# Patient Record
Sex: Male | Born: 1940 | Race: White | Hispanic: No | Marital: Married | Smoking: Former smoker
Health system: Southern US, Community
[De-identification: ages and names within clinical notes are randomized; demographics above are authoritative.]

## PROBLEM LIST (undated history)

## (undated) DIAGNOSIS — L508 Other urticaria: Secondary | ICD-10-CM

## (undated) DIAGNOSIS — G4733 Obstructive sleep apnea (adult) (pediatric): Secondary | ICD-10-CM

## (undated) DIAGNOSIS — K219 Gastro-esophageal reflux disease without esophagitis: Secondary | ICD-10-CM

## (undated) DIAGNOSIS — F0390 Unspecified dementia without behavioral disturbance: Secondary | ICD-10-CM

## (undated) DIAGNOSIS — E785 Hyperlipidemia, unspecified: Secondary | ICD-10-CM

## (undated) DIAGNOSIS — K635 Polyp of colon: Secondary | ICD-10-CM

## (undated) DIAGNOSIS — M419 Scoliosis, unspecified: Secondary | ICD-10-CM

## (undated) DIAGNOSIS — K5790 Diverticulosis of intestine, part unspecified, without perforation or abscess without bleeding: Secondary | ICD-10-CM

## (undated) HISTORY — PX: OTHER SURGICAL HISTORY: SHX169

## (undated) HISTORY — PX: APPENDECTOMY: SHX54

## (undated) HISTORY — DX: Scoliosis, unspecified: M41.9

## (undated) HISTORY — DX: Obstructive sleep apnea (adult) (pediatric): G47.33

## (undated) HISTORY — PX: HERNIA REPAIR: SHX51

## (undated) HISTORY — DX: Unspecified dementia, unspecified severity, without behavioral disturbance, psychotic disturbance, mood disturbance, and anxiety: F03.90

---

## 2012-01-24 ENCOUNTER — Ambulatory Visit: Payer: Self-pay | Admitting: Gastroenterology

## 2012-03-12 ENCOUNTER — Ambulatory Visit: Payer: Self-pay | Admitting: Gastroenterology

## 2014-02-05 DIAGNOSIS — N400 Enlarged prostate without lower urinary tract symptoms: Secondary | ICD-10-CM | POA: Insufficient documentation

## 2014-06-08 ENCOUNTER — Ambulatory Visit: Payer: Self-pay | Admitting: Gastroenterology

## 2014-08-05 DIAGNOSIS — M1990 Unspecified osteoarthritis, unspecified site: Secondary | ICD-10-CM | POA: Insufficient documentation

## 2014-08-05 DIAGNOSIS — E782 Mixed hyperlipidemia: Secondary | ICD-10-CM | POA: Insufficient documentation

## 2014-09-14 LAB — SURGICAL PATHOLOGY

## 2016-02-08 DIAGNOSIS — Z0000 Encounter for general adult medical examination without abnormal findings: Secondary | ICD-10-CM | POA: Diagnosis not present

## 2016-02-08 DIAGNOSIS — Z125 Encounter for screening for malignant neoplasm of prostate: Secondary | ICD-10-CM | POA: Diagnosis not present

## 2016-02-14 DIAGNOSIS — R5382 Chronic fatigue, unspecified: Secondary | ICD-10-CM | POA: Diagnosis not present

## 2016-02-14 DIAGNOSIS — Z0000 Encounter for general adult medical examination without abnormal findings: Secondary | ICD-10-CM | POA: Diagnosis not present

## 2016-02-14 DIAGNOSIS — Z23 Encounter for immunization: Secondary | ICD-10-CM | POA: Diagnosis not present

## 2016-02-14 DIAGNOSIS — G4710 Hypersomnia, unspecified: Secondary | ICD-10-CM | POA: Diagnosis not present

## 2016-03-24 DIAGNOSIS — D1801 Hemangioma of skin and subcutaneous tissue: Secondary | ICD-10-CM | POA: Diagnosis not present

## 2016-03-24 DIAGNOSIS — L821 Other seborrheic keratosis: Secondary | ICD-10-CM | POA: Diagnosis not present

## 2016-03-24 DIAGNOSIS — D485 Neoplasm of uncertain behavior of skin: Secondary | ICD-10-CM | POA: Diagnosis not present

## 2016-03-24 DIAGNOSIS — C44619 Basal cell carcinoma of skin of left upper limb, including shoulder: Secondary | ICD-10-CM | POA: Diagnosis not present

## 2016-05-05 DIAGNOSIS — C44619 Basal cell carcinoma of skin of left upper limb, including shoulder: Secondary | ICD-10-CM | POA: Diagnosis not present

## 2016-05-19 DIAGNOSIS — J40 Bronchitis, not specified as acute or chronic: Secondary | ICD-10-CM | POA: Diagnosis not present

## 2016-06-01 DIAGNOSIS — J40 Bronchitis, not specified as acute or chronic: Secondary | ICD-10-CM | POA: Diagnosis not present

## 2016-06-01 DIAGNOSIS — J4522 Mild intermittent asthma with status asthmaticus: Secondary | ICD-10-CM | POA: Diagnosis not present

## 2016-06-01 DIAGNOSIS — R05 Cough: Secondary | ICD-10-CM | POA: Diagnosis not present

## 2016-06-09 DIAGNOSIS — J189 Pneumonia, unspecified organism: Secondary | ICD-10-CM | POA: Diagnosis not present

## 2016-06-09 DIAGNOSIS — J181 Lobar pneumonia, unspecified organism: Secondary | ICD-10-CM | POA: Diagnosis not present

## 2016-11-02 DIAGNOSIS — D2261 Melanocytic nevi of right upper limb, including shoulder: Secondary | ICD-10-CM | POA: Diagnosis not present

## 2016-11-02 DIAGNOSIS — Z85828 Personal history of other malignant neoplasm of skin: Secondary | ICD-10-CM | POA: Diagnosis not present

## 2016-11-02 DIAGNOSIS — L821 Other seborrheic keratosis: Secondary | ICD-10-CM | POA: Diagnosis not present

## 2016-11-02 DIAGNOSIS — D225 Melanocytic nevi of trunk: Secondary | ICD-10-CM | POA: Diagnosis not present

## 2016-11-08 DIAGNOSIS — R413 Other amnesia: Secondary | ICD-10-CM | POA: Diagnosis not present

## 2016-11-08 DIAGNOSIS — M79605 Pain in left leg: Secondary | ICD-10-CM | POA: Diagnosis not present

## 2016-11-08 DIAGNOSIS — N529 Male erectile dysfunction, unspecified: Secondary | ICD-10-CM | POA: Diagnosis not present

## 2016-11-08 DIAGNOSIS — M79662 Pain in left lower leg: Secondary | ICD-10-CM | POA: Diagnosis not present

## 2017-02-08 DIAGNOSIS — Z0000 Encounter for general adult medical examination without abnormal findings: Secondary | ICD-10-CM | POA: Diagnosis not present

## 2017-02-08 DIAGNOSIS — Z125 Encounter for screening for malignant neoplasm of prostate: Secondary | ICD-10-CM | POA: Diagnosis not present

## 2017-02-08 DIAGNOSIS — N183 Chronic kidney disease, stage 3 (moderate): Secondary | ICD-10-CM | POA: Diagnosis not present

## 2017-02-08 DIAGNOSIS — E782 Mixed hyperlipidemia: Secondary | ICD-10-CM | POA: Diagnosis not present

## 2017-02-15 DIAGNOSIS — Z125 Encounter for screening for malignant neoplasm of prostate: Secondary | ICD-10-CM | POA: Diagnosis not present

## 2017-02-15 DIAGNOSIS — N183 Chronic kidney disease, stage 3 (moderate): Secondary | ICD-10-CM | POA: Diagnosis not present

## 2017-02-15 DIAGNOSIS — E782 Mixed hyperlipidemia: Secondary | ICD-10-CM | POA: Diagnosis not present

## 2017-02-15 DIAGNOSIS — Z23 Encounter for immunization: Secondary | ICD-10-CM | POA: Diagnosis not present

## 2017-02-15 DIAGNOSIS — Z79899 Other long term (current) drug therapy: Secondary | ICD-10-CM | POA: Diagnosis not present

## 2017-02-15 DIAGNOSIS — Z0000 Encounter for general adult medical examination without abnormal findings: Secondary | ICD-10-CM | POA: Diagnosis not present

## 2017-04-17 DIAGNOSIS — J40 Bronchitis, not specified as acute or chronic: Secondary | ICD-10-CM | POA: Diagnosis not present

## 2017-04-17 DIAGNOSIS — J45902 Unspecified asthma with status asthmaticus: Secondary | ICD-10-CM | POA: Diagnosis not present

## 2017-04-19 DIAGNOSIS — Z961 Presence of intraocular lens: Secondary | ICD-10-CM | POA: Diagnosis not present

## 2017-05-03 DIAGNOSIS — H02834 Dermatochalasis of left upper eyelid: Secondary | ICD-10-CM | POA: Diagnosis not present

## 2017-05-03 DIAGNOSIS — H02403 Unspecified ptosis of bilateral eyelids: Secondary | ICD-10-CM | POA: Diagnosis not present

## 2017-05-03 DIAGNOSIS — H02831 Dermatochalasis of right upper eyelid: Secondary | ICD-10-CM | POA: Diagnosis not present

## 2017-09-06 DIAGNOSIS — G5702 Lesion of sciatic nerve, left lower limb: Secondary | ICD-10-CM | POA: Diagnosis not present

## 2017-09-12 DIAGNOSIS — H02403 Unspecified ptosis of bilateral eyelids: Secondary | ICD-10-CM | POA: Diagnosis not present

## 2018-02-14 DIAGNOSIS — Z79899 Other long term (current) drug therapy: Secondary | ICD-10-CM | POA: Diagnosis not present

## 2018-02-14 DIAGNOSIS — Z125 Encounter for screening for malignant neoplasm of prostate: Secondary | ICD-10-CM | POA: Diagnosis not present

## 2018-02-14 DIAGNOSIS — E782 Mixed hyperlipidemia: Secondary | ICD-10-CM | POA: Diagnosis not present

## 2018-02-21 ENCOUNTER — Other Ambulatory Visit: Payer: Self-pay | Admitting: Internal Medicine

## 2018-02-21 DIAGNOSIS — M5116 Intervertebral disc disorders with radiculopathy, lumbar region: Secondary | ICD-10-CM | POA: Diagnosis not present

## 2018-02-21 DIAGNOSIS — N6320 Unspecified lump in the left breast, unspecified quadrant: Secondary | ICD-10-CM | POA: Diagnosis not present

## 2018-02-21 DIAGNOSIS — R1310 Dysphagia, unspecified: Secondary | ICD-10-CM | POA: Diagnosis not present

## 2018-02-21 DIAGNOSIS — M4126 Other idiopathic scoliosis, lumbar region: Secondary | ICD-10-CM | POA: Insufficient documentation

## 2018-02-21 DIAGNOSIS — Z125 Encounter for screening for malignant neoplasm of prostate: Secondary | ICD-10-CM | POA: Diagnosis not present

## 2018-02-21 DIAGNOSIS — M5440 Lumbago with sciatica, unspecified side: Secondary | ICD-10-CM

## 2018-02-21 DIAGNOSIS — E782 Mixed hyperlipidemia: Secondary | ICD-10-CM | POA: Diagnosis not present

## 2018-02-21 DIAGNOSIS — Z79899 Other long term (current) drug therapy: Secondary | ICD-10-CM | POA: Diagnosis not present

## 2018-02-21 DIAGNOSIS — Z0000 Encounter for general adult medical examination without abnormal findings: Secondary | ICD-10-CM | POA: Diagnosis not present

## 2018-02-21 DIAGNOSIS — Z23 Encounter for immunization: Secondary | ICD-10-CM | POA: Diagnosis not present

## 2018-03-01 ENCOUNTER — Ambulatory Visit
Admission: RE | Admit: 2018-03-01 | Discharge: 2018-03-01 | Disposition: A | Discharge status: Home / Self Care | Payer: PPO | Source: Ambulatory Visit | Attending: Internal Medicine | Admitting: Internal Medicine

## 2018-03-01 DIAGNOSIS — R928 Other abnormal and inconclusive findings on diagnostic imaging of breast: Secondary | ICD-10-CM | POA: Diagnosis not present

## 2018-03-01 DIAGNOSIS — N6489 Other specified disorders of breast: Secondary | ICD-10-CM | POA: Diagnosis not present

## 2018-03-01 DIAGNOSIS — N6320 Unspecified lump in the left breast, unspecified quadrant: Secondary | ICD-10-CM

## 2018-03-07 ENCOUNTER — Ambulatory Visit
Admission: RE | Admit: 2018-03-07 | Discharge: 2018-03-07 | Disposition: A | Discharge status: Home / Self Care | Payer: PPO | Source: Ambulatory Visit | Attending: Internal Medicine | Admitting: Internal Medicine

## 2018-03-07 ENCOUNTER — Other Ambulatory Visit: Payer: Self-pay | Admitting: Gastroenterology

## 2018-03-07 DIAGNOSIS — G8929 Other chronic pain: Secondary | ICD-10-CM | POA: Insufficient documentation

## 2018-03-07 DIAGNOSIS — K219 Gastro-esophageal reflux disease without esophagitis: Secondary | ICD-10-CM | POA: Diagnosis not present

## 2018-03-07 DIAGNOSIS — M5440 Lumbago with sciatica, unspecified side: Secondary | ICD-10-CM | POA: Diagnosis not present

## 2018-03-07 DIAGNOSIS — N281 Cyst of kidney, acquired: Secondary | ICD-10-CM | POA: Insufficient documentation

## 2018-03-07 DIAGNOSIS — M48061 Spinal stenosis, lumbar region without neurogenic claudication: Secondary | ICD-10-CM | POA: Diagnosis not present

## 2018-03-07 DIAGNOSIS — M4316 Spondylolisthesis, lumbar region: Secondary | ICD-10-CM | POA: Diagnosis not present

## 2018-03-07 DIAGNOSIS — K8020 Calculus of gallbladder without cholecystitis without obstruction: Secondary | ICD-10-CM | POA: Diagnosis not present

## 2018-03-07 DIAGNOSIS — R1310 Dysphagia, unspecified: Secondary | ICD-10-CM

## 2018-03-07 DIAGNOSIS — M47816 Spondylosis without myelopathy or radiculopathy, lumbar region: Secondary | ICD-10-CM | POA: Insufficient documentation

## 2018-03-13 ENCOUNTER — Ambulatory Visit
Admission: RE | Admit: 2018-03-13 | Discharge: 2018-03-13 | Disposition: A | Discharge status: Home / Self Care | Payer: PPO | Source: Ambulatory Visit | Attending: Gastroenterology | Admitting: Gastroenterology

## 2018-03-13 DIAGNOSIS — K449 Diaphragmatic hernia without obstruction or gangrene: Secondary | ICD-10-CM | POA: Diagnosis not present

## 2018-03-13 DIAGNOSIS — R1310 Dysphagia, unspecified: Secondary | ICD-10-CM | POA: Diagnosis not present

## 2018-03-25 DIAGNOSIS — M5416 Radiculopathy, lumbar region: Secondary | ICD-10-CM | POA: Diagnosis not present

## 2018-03-25 DIAGNOSIS — M5136 Other intervertebral disc degeneration, lumbar region: Secondary | ICD-10-CM | POA: Diagnosis not present

## 2018-05-06 ENCOUNTER — Encounter: Payer: Self-pay | Admitting: Student

## 2018-05-07 ENCOUNTER — Encounter
Admission: RE | Disposition: A | Discharge status: Home / Self Care | Payer: Self-pay | Source: Home / Self Care | Attending: Gastroenterology

## 2018-05-07 ENCOUNTER — Ambulatory Visit
Admission: RE | Admit: 2018-05-07 | Discharge: 2018-05-07 | Disposition: A | Discharge status: Home / Self Care | Payer: PPO | Attending: Gastroenterology | Admitting: Gastroenterology

## 2018-05-07 ENCOUNTER — Ambulatory Visit: Payer: PPO | Admitting: Certified Registered"

## 2018-05-07 ENCOUNTER — Encounter: Payer: Self-pay | Admitting: *Deleted

## 2018-05-07 DIAGNOSIS — K449 Diaphragmatic hernia without obstruction or gangrene: Secondary | ICD-10-CM | POA: Diagnosis not present

## 2018-05-07 DIAGNOSIS — K2960 Other gastritis without bleeding: Secondary | ICD-10-CM | POA: Insufficient documentation

## 2018-05-07 DIAGNOSIS — Z79899 Other long term (current) drug therapy: Secondary | ICD-10-CM | POA: Diagnosis not present

## 2018-05-07 DIAGNOSIS — K210 Gastro-esophageal reflux disease with esophagitis: Secondary | ICD-10-CM | POA: Diagnosis not present

## 2018-05-07 DIAGNOSIS — K2210 Ulcer of esophagus without bleeding: Secondary | ICD-10-CM | POA: Diagnosis not present

## 2018-05-07 DIAGNOSIS — R1310 Dysphagia, unspecified: Secondary | ICD-10-CM | POA: Insufficient documentation

## 2018-05-07 DIAGNOSIS — K259 Gastric ulcer, unspecified as acute or chronic, without hemorrhage or perforation: Secondary | ICD-10-CM | POA: Diagnosis not present

## 2018-05-07 DIAGNOSIS — K2980 Duodenitis without bleeding: Secondary | ICD-10-CM | POA: Insufficient documentation

## 2018-05-07 DIAGNOSIS — K222 Esophageal obstruction: Secondary | ICD-10-CM | POA: Diagnosis not present

## 2018-05-07 DIAGNOSIS — K228 Other specified diseases of esophagus: Secondary | ICD-10-CM | POA: Diagnosis not present

## 2018-05-07 DIAGNOSIS — Z791 Long term (current) use of non-steroidal anti-inflammatories (NSAID): Secondary | ICD-10-CM | POA: Insufficient documentation

## 2018-05-07 DIAGNOSIS — K3189 Other diseases of stomach and duodenum: Secondary | ICD-10-CM | POA: Diagnosis not present

## 2018-05-07 DIAGNOSIS — K219 Gastro-esophageal reflux disease without esophagitis: Secondary | ICD-10-CM | POA: Diagnosis not present

## 2018-05-07 DIAGNOSIS — K2900 Acute gastritis without bleeding: Secondary | ICD-10-CM | POA: Diagnosis not present

## 2018-05-07 DIAGNOSIS — Z87891 Personal history of nicotine dependence: Secondary | ICD-10-CM | POA: Diagnosis not present

## 2018-05-07 DIAGNOSIS — K2990 Gastroduodenitis, unspecified, without bleeding: Secondary | ICD-10-CM | POA: Diagnosis not present

## 2018-05-07 HISTORY — DX: Polyp of colon: K63.5

## 2018-05-07 HISTORY — PX: ESOPHAGOGASTRODUODENOSCOPY (EGD) WITH PROPOFOL: SHX5813

## 2018-05-07 HISTORY — DX: Gastro-esophageal reflux disease without esophagitis: K21.9

## 2018-05-07 HISTORY — DX: Diverticulosis of intestine, part unspecified, without perforation or abscess without bleeding: K57.90

## 2018-05-07 HISTORY — DX: Other urticaria: L50.8

## 2018-05-07 HISTORY — DX: Hyperlipidemia, unspecified: E78.5

## 2018-05-07 SURGERY — ESOPHAGOGASTRODUODENOSCOPY (EGD) WITH PROPOFOL
Anesthesia: General

## 2018-05-07 MED ORDER — PHENYLEPHRINE HCL 10 MG/ML IJ SOLN
INTRAMUSCULAR | Status: DC | PRN
  Administered 2018-05-07: 100 ug via INTRAVENOUS
  Administered 2018-05-07: 150 ug via INTRAVENOUS

## 2018-05-07 MED ORDER — PROPOFOL 10 MG/ML IV BOLUS
INTRAVENOUS | Status: AC
  Filled 2018-05-07: qty 40

## 2018-05-07 MED ORDER — SODIUM CHLORIDE 0.9 % IV SOLN: INTRAVENOUS | Status: DC

## 2018-05-07 MED ORDER — PROPOFOL 500 MG/50ML IV EMUL
INTRAVENOUS | Status: DC | PRN
  Administered 2018-05-07: 100 ug/kg/min via INTRAVENOUS

## 2018-05-07 MED ORDER — PROPOFOL 10 MG/ML IV BOLUS
INTRAVENOUS | Status: DC | PRN
  Administered 2018-05-07: 20 mg via INTRAVENOUS
  Administered 2018-05-07: 30 mg via INTRAVENOUS
  Administered 2018-05-07 (×2): 20 mg via INTRAVENOUS
  Administered 2018-05-07: 40 mg via INTRAVENOUS
  Administered 2018-05-07 (×2): 20 mg via INTRAVENOUS

## 2018-05-07 MED ORDER — SODIUM CHLORIDE 0.9 % IV SOLN
INTRAVENOUS | Status: DC
  Administered 2018-05-07: 10:00:00 via INTRAVENOUS

## 2018-05-07 MED ORDER — PROPOFOL 10 MG/ML IV BOLUS
INTRAVENOUS | Status: AC
  Filled 2018-05-07: qty 20

## 2018-05-07 NOTE — Anesthesia Preprocedure Evaluation (Signed)
Anesthesia Evaluation  Patient identified by MRN, date of birth, ID band Patient awake    Reviewed: Allergy & Precautions, H&P , NPO status , Patient's Chart, lab work & pertinent test results, reviewed documented beta blocker date and time   Airway Mallampati: II   Neck ROM: full    Dental  (+) Poor Dentition   Pulmonary neg pulmonary ROS, former smoker,    Pulmonary exam normal        Cardiovascular Exercise Tolerance: Good negative cardio ROS Normal cardiovascular exam Rhythm:regular Rate:Normal     Neuro/Psych negative neurological ROS  negative psych ROS   GI/Hepatic Neg liver ROS, GERD  Medicated,  Endo/Other  negative endocrine ROS  Renal/GU negative Renal ROS  negative genitourinary   Musculoskeletal   Abdominal   Peds  Hematology negative hematology ROS (+)   Anesthesia Other Findings Past Medical History: No date: Colon polyps No date: Diverticulosis No date: GERD (gastroesophageal reflux disease) No date: Hyperlipidemia No date: Recurrent urticaria Past Surgical History: No date: APPENDECTOMY No date: HERNIA REPAIR No date: Left knee surgery BMI    Body Mass Index:  33.39 kg/m     Reproductive/Obstetrics negative OB ROS                             Anesthesia Physical Anesthesia Plan  ASA: II  Anesthesia Plan: General   Post-op Pain Management:    Induction:   PONV Risk Score and Plan:   Airway Management Planned:   Additional Equipment:   Intra-op Plan:   Post-operative Plan:   Informed Consent: I have reviewed the patients History and Physical, chart, labs and discussed the procedure including the risks, benefits and alternatives for the proposed anesthesia with the patient or authorized representative who has indicated his/her understanding and acceptance.   Dental Advisory Given  Plan Discussed with: CRNA  Anesthesia Plan Comments:          Anesthesia Quick Evaluation

## 2018-05-07 NOTE — Anesthesia Post-op Follow-up Note (Signed)
Anesthesia QCDR form completed.        

## 2018-05-07 NOTE — H&P (Signed)
Outpatient short stay form Pre-procedure 05/07/2018 9:14 AM Philip Sails MD  Primary Physician: Dr. Emily Filbert  Reason for visit: EGD  History of present illness: Patient is a 77 year old male presenting today for an EGD in regards to his history of dysphagia.  He had a barium swallow on 03/13/2018 showing a small sliding hiatal hernia as well as a small mild B ring that delays the transit of the standard barium tablet.  He had an EGD on 03/12/2012.  There is no evidence of Barrett's esophagus or ring at that time.  He has been taking Mobic as well as Protonix 40 mg once a day.  Takes no aspirin or blood thinner.   No current facility-administered medications for this encounter.   Medications Prior to Admission  Medication Sig Dispense Refill Last Dose  . donepezil (ARICEPT) 10 MG tablet Take 10 mg by mouth at bedtime.     . famotidine (PEPCID) 40 MG tablet Take 40 mg by mouth daily.     . hydrochlorothiazide (MICROZIDE) 12.5 MG capsule Take 12.5 mg by mouth daily.     . meloxicam (MOBIC) 7.5 MG tablet Take 7.5 mg by mouth daily.     . pantoprazole (PROTONIX) 40 MG tablet Take 40 mg by mouth daily.     . tamsulosin (FLOMAX) 0.4 MG CAPS capsule Take 0.4 mg by mouth.     Marland Kitchen tiZANidine (ZANAFLEX) 4 MG tablet Take 4 mg by mouth every 6 (six) hours as needed for muscle spasms.     . traMADol (ULTRAM) 50 MG tablet Take by mouth every 6 (six) hours as needed.     . vitamin B-12 (CYANOCOBALAMIN) 1000 MCG tablet Take 1,000 mcg by mouth daily.        Allergies  Allergen Reactions  . Penicillins      Past Medical History:  Diagnosis Date  . Colon polyps   . Diverticulosis   . GERD (gastroesophageal reflux disease)   . Hyperlipidemia   . Recurrent urticaria     Review of systems:      Physical Exam    Heart and lungs: Regular rate and rhythm without rub or gallop, lungs are bilaterally clear.    HEENT: Normocephalic atraumatic eyes are anicteric    Other:    Pertinant  exam for procedure: Soft nontender nondistended bowel sounds positive normoactive.    Planned proceedures: EGD and indicated procedures. I have discussed the risks benefits and complications of procedures to include not limited to bleeding, infection, perforation and the risk of sedation and the patient wishes to proceed.    Philip Sails, MD Gastroenterology 05/07/2018  9:14 AM

## 2018-05-07 NOTE — Op Note (Signed)
[facility] Gastroenterology Patient Name: Philip Brady Procedure Date: 05/07/2018 9:50 AM MRN: [id number] Account #: [id number] Date of Birth: December 18, 1940 Admit Type: Outpatient Age: 77 Room: [facility] ENDO ROOM 3 Gender: Male Note Status: Finalized Procedure:            Upper GI endoscopy Indications:          Dysphagia Providers:            Lollie Sails, MD Referring MD:         Rusty Aus, MD (Referring MD) Medicines:            Monitored Anesthesia Care Complications:        No immediate complications. Procedure:            Pre-Anesthesia Assessment:                       - ASA Grade Assessment: III - A patient with severe                        systemic disease.                       After obtaining informed consent, the endoscope was                        passed under direct vision. Throughout the procedure,                        the patient's blood pressure, pulse, and oxygen                        saturations were monitored continuously. The Endoscope                        was introduced through the mouth, and advanced to the                        third part of duodenum. The upper GI endoscopy was                        accomplished without difficulty. The patient tolerated                        the procedure well. Findings:      LA Grade A (one or more mucosal breaks less than 5 mm, not extending       between tops of 2 mucosal folds) esophagitis with no bleeding was found.       Biopsies taken after dilation.      A non-obstructing Schatzki ring was found at the gastroesophageal       junction.      A TTS dilator was passed through the scope. Dilation with a 12-13.5-15       mm balloon and a 15-16.5-18 mm balloon dilator was performed to 18 mm at       the gastroesophageal junction. Of note, at each step the ring seems to       open further than the balloon, probable ring contraction/spasm..      Abnormal motility was noted in the lower  third of the esophagus. The       cricopharyngeus was normal. There is spasticity of the  esophageal body.       The distal esophagus/lower esophageal sphincter is spastic, but gives up       passage to the endoscope. Tertiary peristaltic waves are noted.      A small hiatal hernia was found. The Z-line was a variable distance from       incisors; the hiatal hernia was sliding.      Patchy moderate inflammation characterized by congestion (edema),       erosions, erythema and shallow ulcerations was found in the gastric       antrum. Biopsies were taken with a cold forceps for histology. Biopsies       were taken with a cold forceps for Helicobacter pylori testing.      Patchy mild inflammation characterized by erythema and granularity was       found in the duodenal bulb. Biopsies were taken with a cold forceps for       histology.      The exam of the duodenum was otherwise normal.      Biopsies were taken with a cold forceps in the third portion of the       duodenum for histology.      The cardia and gastric fundus were normal on retroflexion otherwise,       note hiatal hernia. Impression:           - LA Grade A erosive esophagitis.                       - small sliding hiatal hernia.                       - Gastritis, erosive. Recommendation:       - Use Protonix (pantoprazole) 40 mg PO daily daily. Procedure Code(s):    --- Professional ---                       [phone], Esophagogastroduodenoscopy, flexible, transoral;                        with transendoscopic balloon dilation of esophagus                        (less than 30 mm diameter) Diagnosis Code(s):    --- Professional ---                       K20.8, Other esophagitis                       K29.70, Gastritis, unspecified, without bleeding                       R13.10, Dysphagia, unspecified CPT copyright 2018 American [employer]. All rights reserved. The codes documented in this report are preliminary and upon  coder review may  be revised to meet current compliance requirements. Lollie Sails, MD 05/07/2018 10:43:26 AM This report has been signed electronically. Number of Addenda: 0 Note Initiated On: 05/07/2018 9:50 AM      [facility]

## 2018-05-07 NOTE — Transfer of Care (Signed)
Immediate Anesthesia Transfer of Care Note  Patient: Philip Brady  Procedure(s) Performed: ESOPHAGOGASTRODUODENOSCOPY (EGD) WITH PROPOFOL (N/A )  Patient Location: Endoscopy Unit  Anesthesia Type:General  Level of Consciousness: awake and patient cooperative  Airway & Oxygen Therapy: Patient Spontanous Breathing and Patient connected to nasal cannula oxygen  Post-op Assessment: Report given to RN and Post -op Vital signs reviewed and stable  Post vital signs: stable  Last Vitals:  Vitals Value Taken Time  BP 117/77 05/07/2018 10:41 AM  Temp 36.1 C 05/07/2018 10:40 AM  Pulse 69 05/07/2018 10:44 AM  Resp 19 05/07/2018 10:44 AM  SpO2 96 % 05/07/2018 10:44 AM  Vitals shown include unvalidated device data.  Last Pain:  Vitals:   05/07/18 1040  TempSrc: Tympanic  PainSc:          Complications: No apparent anesthesia complications

## 2018-05-08 ENCOUNTER — Encounter: Payer: Self-pay | Admitting: Gastroenterology

## 2018-05-08 LAB — SURGICAL PATHOLOGY

## 2018-05-08 NOTE — Anesthesia Postprocedure Evaluation (Signed)
Anesthesia Post Note  Patient: Philip Brady  Procedure(s) Performed: ESOPHAGOGASTRODUODENOSCOPY (EGD) WITH PROPOFOL (N/A )  Patient location during evaluation: PACU Anesthesia Type: General Level of consciousness: awake and alert Pain management: pain level controlled Vital Signs Assessment: post-procedure vital signs reviewed and stable Respiratory status: spontaneous breathing, nonlabored ventilation, respiratory function stable and patient connected to nasal cannula oxygen Cardiovascular status: blood pressure returned to baseline and stable Postop Assessment: no apparent nausea or vomiting Anesthetic complications: no     Last Vitals:  Vitals:   05/07/18 1050 05/07/18 1100  BP: 110/87 113/72  Pulse: 63 64  Resp: 14 16  Temp:    SpO2: 97% 97%    Last Pain:  Vitals:   05/07/18 1040  TempSrc: Tympanic  PainSc:                  Molli Barrows

## 2018-05-27 DIAGNOSIS — M5136 Other intervertebral disc degeneration, lumbar region: Secondary | ICD-10-CM | POA: Diagnosis not present

## 2018-05-27 DIAGNOSIS — M5416 Radiculopathy, lumbar region: Secondary | ICD-10-CM | POA: Diagnosis not present

## 2018-06-04 DIAGNOSIS — N3281 Overactive bladder: Secondary | ICD-10-CM | POA: Diagnosis not present

## 2018-06-04 DIAGNOSIS — R350 Frequency of micturition: Secondary | ICD-10-CM | POA: Diagnosis not present

## 2018-08-27 DIAGNOSIS — M5416 Radiculopathy, lumbar region: Secondary | ICD-10-CM | POA: Insufficient documentation

## 2018-08-27 DIAGNOSIS — G4733 Obstructive sleep apnea (adult) (pediatric): Secondary | ICD-10-CM | POA: Diagnosis not present

## 2018-08-27 DIAGNOSIS — E782 Mixed hyperlipidemia: Secondary | ICD-10-CM | POA: Diagnosis not present

## 2018-08-27 DIAGNOSIS — K210 Gastro-esophageal reflux disease with esophagitis: Secondary | ICD-10-CM | POA: Diagnosis not present

## 2018-08-27 DIAGNOSIS — E538 Deficiency of other specified B group vitamins: Secondary | ICD-10-CM | POA: Diagnosis not present

## 2018-08-27 DIAGNOSIS — D485 Neoplasm of uncertain behavior of skin: Secondary | ICD-10-CM | POA: Diagnosis not present

## 2018-08-27 DIAGNOSIS — L918 Other hypertrophic disorders of the skin: Secondary | ICD-10-CM | POA: Diagnosis not present

## 2018-08-27 DIAGNOSIS — L821 Other seborrheic keratosis: Secondary | ICD-10-CM | POA: Diagnosis not present

## 2018-08-27 DIAGNOSIS — Z0000 Encounter for general adult medical examination without abnormal findings: Secondary | ICD-10-CM | POA: Diagnosis not present

## 2018-08-27 DIAGNOSIS — Z125 Encounter for screening for malignant neoplasm of prostate: Secondary | ICD-10-CM | POA: Diagnosis not present

## 2018-08-27 DIAGNOSIS — C44629 Squamous cell carcinoma of skin of left upper limb, including shoulder: Secondary | ICD-10-CM | POA: Diagnosis not present

## 2018-08-27 DIAGNOSIS — D2271 Melanocytic nevi of right lower limb, including hip: Secondary | ICD-10-CM | POA: Diagnosis not present

## 2018-08-27 DIAGNOSIS — L720 Epidermal cyst: Secondary | ICD-10-CM | POA: Diagnosis not present

## 2018-08-27 DIAGNOSIS — Z1231 Encounter for screening mammogram for malignant neoplasm of breast: Secondary | ICD-10-CM | POA: Diagnosis not present

## 2018-08-27 DIAGNOSIS — Z85828 Personal history of other malignant neoplasm of skin: Secondary | ICD-10-CM | POA: Diagnosis not present

## 2018-11-20 DIAGNOSIS — M5416 Radiculopathy, lumbar region: Secondary | ICD-10-CM | POA: Diagnosis not present

## 2018-11-27 DIAGNOSIS — M5416 Radiculopathy, lumbar region: Secondary | ICD-10-CM | POA: Diagnosis not present

## 2018-11-28 IMAGING — RF DG ESOPHAGUS
7 of 10 series · 13 of 24 positions shown · non-contrast
Comparison: No prior.

CLINICAL DATA: Dysphagia.

EXAM:
ESOPHOGRAM / BARIUM SWALLOW / BARIUM TABLET STUDY
TECHNIQUE: Combined double contrast and single contrast examination performed
using effervescent crystals, thick barium liquid, and thin barium
liquid. The patient was observed with fluoroscopy swallowing a 13 mm
barium sulphate tablet.
FLUOROSCOPY TIME:  Fluoroscopy Time:  2 minutes 24 seconds
Radiation Exposure Index (if provided by the fluoroscopic device):
40.3 mGy

[Series 1: fluoro_barium 2fps_bw · 0.18mm/px · 2 of 6 frames shown (1 of 4)]
[frame 1/6]
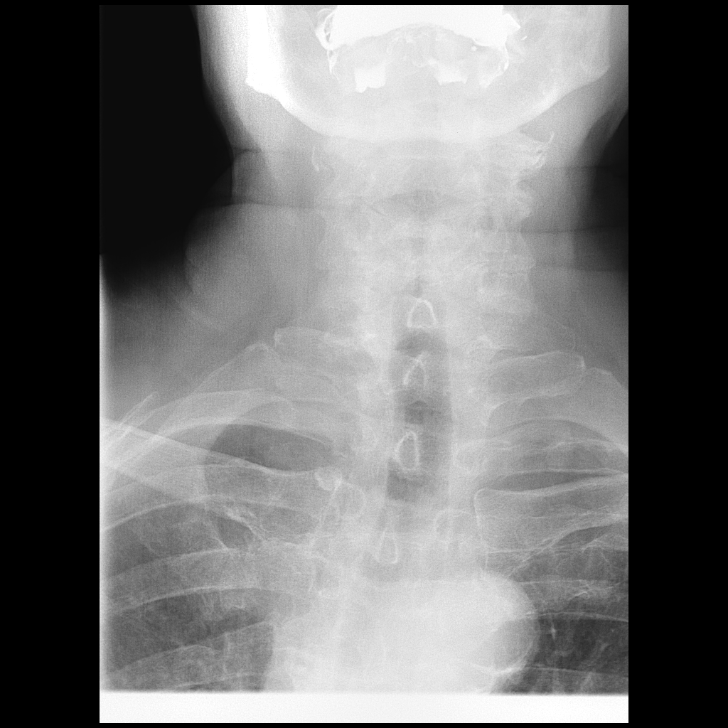
[frame 4/6]
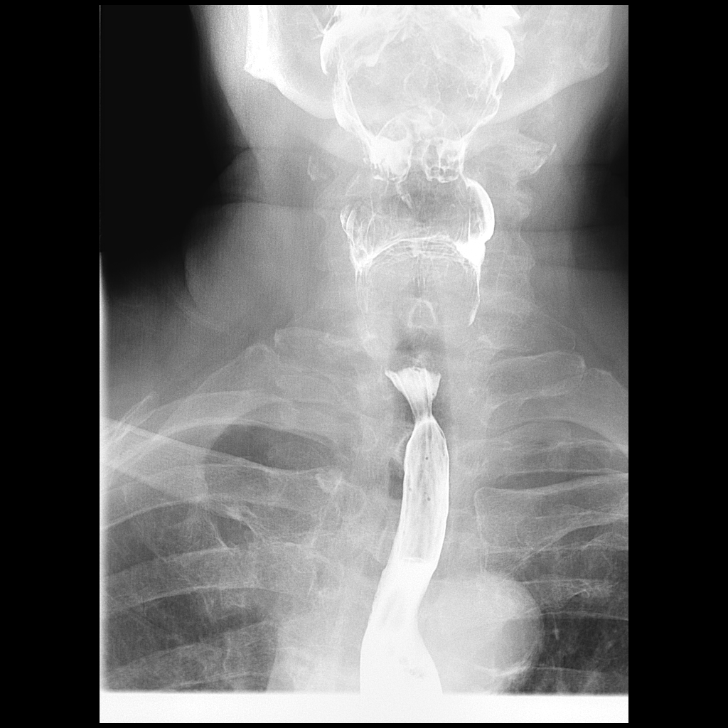

[Series 2: fluoro_barium 2fps_bw · 0.18mm/px · 2 of 4 frames shown (2 of 4)]
[frame 1/4]
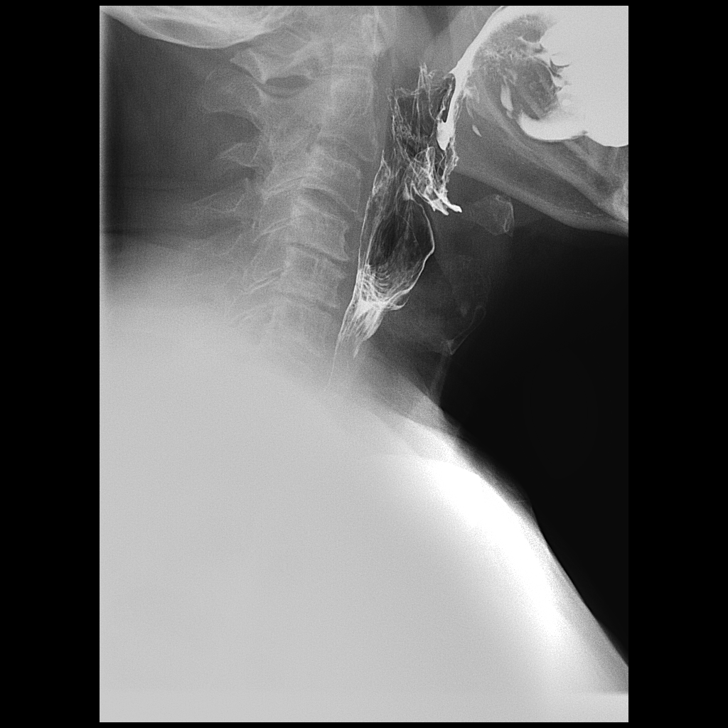
[frame 4/4]
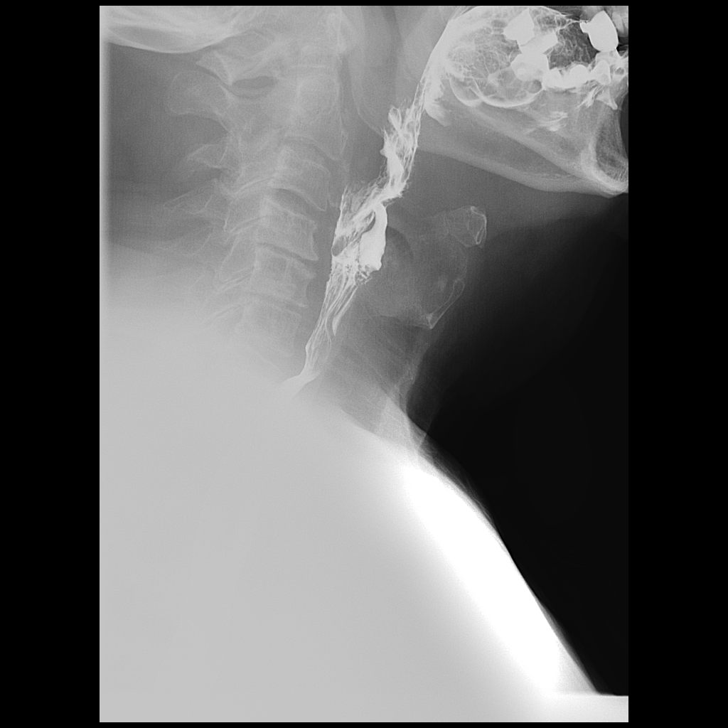

[Series 4: fluoro_barium 2fps_bw · 0.18mm/px · 1 of 1 slices shown (3 of 4)]
[im 1/1]
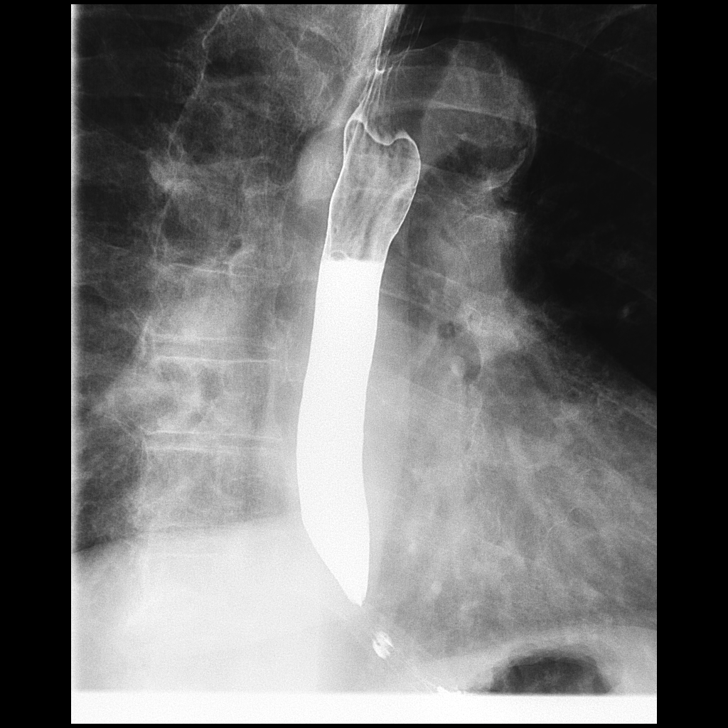

[Series 6: fluoro_barium 2fps_bw · 0.18mm/px · 1 of 1 slices shown (4 of 4)]
[im 1/1]
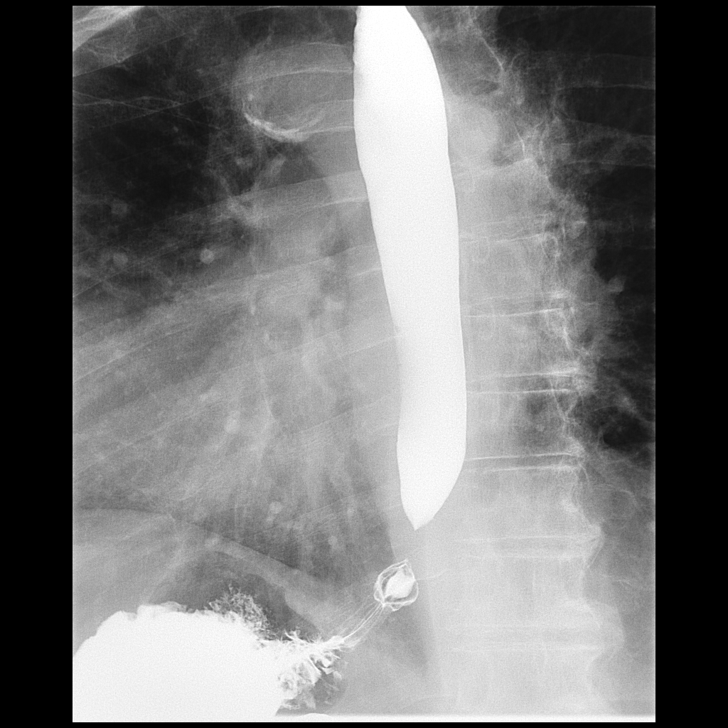

[Series 8: cp_standard · 0.36mm/px · 3 of 41 frames shown (1 of 3)]
[frame 7/41]
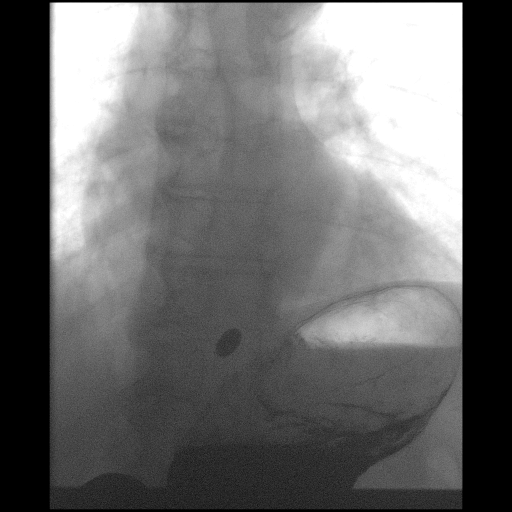
[frame 21/41]
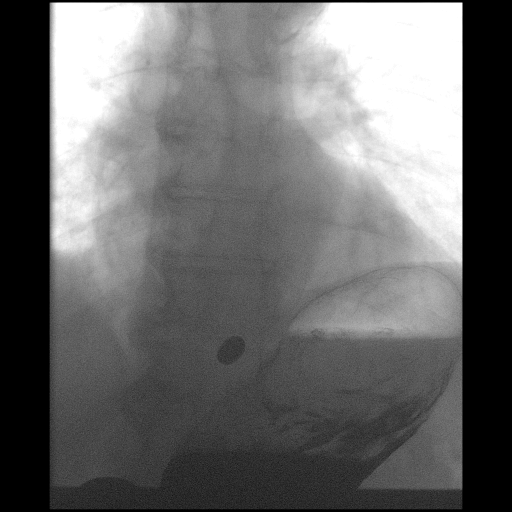
[frame 35/41]
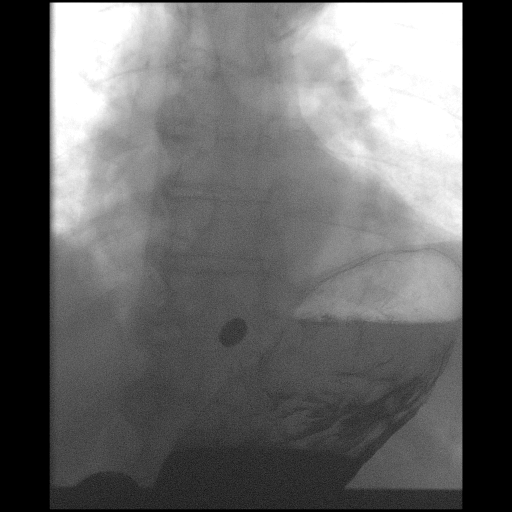

[Series 9: cp_standard · 0.36mm/px · 2 of 9 frames shown (2 of 3)]
[frame 5/9]
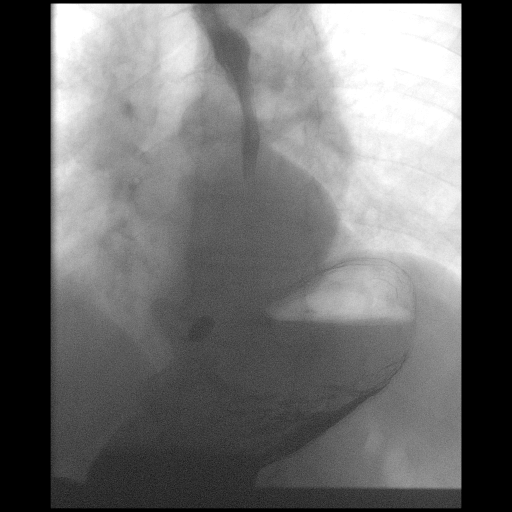
[frame 9/9]
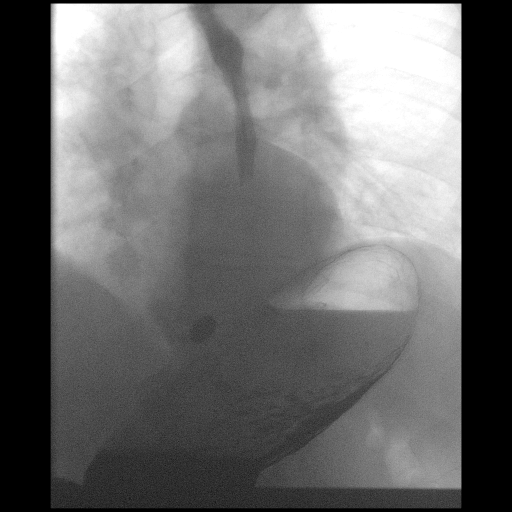

[Series 10: cp_standard · 0.36mm/px · 2 of 73 frames shown (3 of 3)]
[frame 37/73]
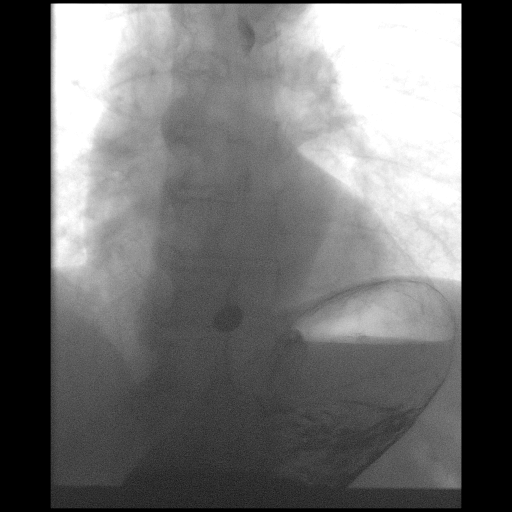
[frame 72/73]
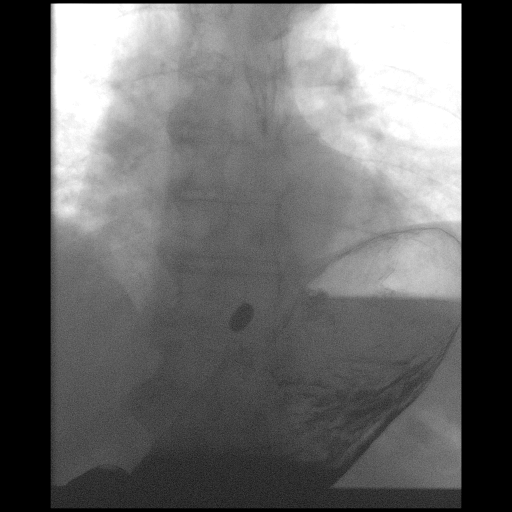

[13 of 24 positions shown; findings below may reference images not displayed]

FINDINGS: Cervical esophagus is normal. Small sliding hiatal hernia with a
slightly prominent B ring noted. Delay in passage of a standardized
barium tablet noted at the level of the distal esophagus. No reflux
noted.
IMPRESSION: Small sliding hiatal hernia with slightly prominent B ring. Slight
delay in passage of a standardized barium tablet at the level of the
distal esophagus was noticed. No reflux.

## 2018-11-29 DIAGNOSIS — M5416 Radiculopathy, lumbar region: Secondary | ICD-10-CM | POA: Diagnosis not present

## 2018-12-11 DIAGNOSIS — M5416 Radiculopathy, lumbar region: Secondary | ICD-10-CM | POA: Diagnosis not present

## 2018-12-13 DIAGNOSIS — M5416 Radiculopathy, lumbar region: Secondary | ICD-10-CM | POA: Diagnosis not present

## 2018-12-17 DIAGNOSIS — M5416 Radiculopathy, lumbar region: Secondary | ICD-10-CM | POA: Diagnosis not present

## 2018-12-19 DIAGNOSIS — M5416 Radiculopathy, lumbar region: Secondary | ICD-10-CM | POA: Diagnosis not present

## 2019-01-01 DIAGNOSIS — M5416 Radiculopathy, lumbar region: Secondary | ICD-10-CM | POA: Diagnosis not present

## 2019-01-08 DIAGNOSIS — M5416 Radiculopathy, lumbar region: Secondary | ICD-10-CM | POA: Diagnosis not present

## 2019-01-15 DIAGNOSIS — M5416 Radiculopathy, lumbar region: Secondary | ICD-10-CM | POA: Diagnosis not present

## 2019-01-15 DIAGNOSIS — G4733 Obstructive sleep apnea (adult) (pediatric): Secondary | ICD-10-CM | POA: Diagnosis not present

## 2019-01-17 DIAGNOSIS — G4733 Obstructive sleep apnea (adult) (pediatric): Secondary | ICD-10-CM | POA: Diagnosis not present

## 2019-01-22 DIAGNOSIS — M5416 Radiculopathy, lumbar region: Secondary | ICD-10-CM | POA: Diagnosis not present

## 2019-02-24 DIAGNOSIS — E782 Mixed hyperlipidemia: Secondary | ICD-10-CM | POA: Diagnosis not present

## 2019-02-24 DIAGNOSIS — E538 Deficiency of other specified B group vitamins: Secondary | ICD-10-CM | POA: Diagnosis not present

## 2019-02-24 DIAGNOSIS — Z125 Encounter for screening for malignant neoplasm of prostate: Secondary | ICD-10-CM | POA: Diagnosis not present

## 2019-03-03 DIAGNOSIS — G4733 Obstructive sleep apnea (adult) (pediatric): Secondary | ICD-10-CM | POA: Insufficient documentation

## 2019-03-03 DIAGNOSIS — R0600 Dyspnea, unspecified: Secondary | ICD-10-CM | POA: Diagnosis not present

## 2019-03-03 DIAGNOSIS — Z23 Encounter for immunization: Secondary | ICD-10-CM | POA: Diagnosis not present

## 2019-03-03 DIAGNOSIS — E782 Mixed hyperlipidemia: Secondary | ICD-10-CM | POA: Diagnosis not present

## 2019-03-03 DIAGNOSIS — E538 Deficiency of other specified B group vitamins: Secondary | ICD-10-CM | POA: Diagnosis not present

## 2019-03-03 DIAGNOSIS — Z0000 Encounter for general adult medical examination without abnormal findings: Secondary | ICD-10-CM | POA: Diagnosis not present

## 2019-03-03 DIAGNOSIS — F0390 Unspecified dementia without behavioral disturbance: Secondary | ICD-10-CM | POA: Diagnosis not present

## 2019-03-03 DIAGNOSIS — Z125 Encounter for screening for malignant neoplasm of prostate: Secondary | ICD-10-CM | POA: Diagnosis not present

## 2019-03-03 DIAGNOSIS — M5416 Radiculopathy, lumbar region: Secondary | ICD-10-CM | POA: Diagnosis not present

## 2019-03-17 DIAGNOSIS — Z20828 Contact with and (suspected) exposure to other viral communicable diseases: Secondary | ICD-10-CM | POA: Diagnosis not present

## 2019-04-14 DIAGNOSIS — M5116 Intervertebral disc disorders with radiculopathy, lumbar region: Secondary | ICD-10-CM | POA: Diagnosis not present

## 2019-05-20 ENCOUNTER — Other Ambulatory Visit: Payer: Self-pay | Admitting: Internal Medicine

## 2019-05-20 DIAGNOSIS — M5440 Lumbago with sciatica, unspecified side: Secondary | ICD-10-CM

## 2019-05-20 DIAGNOSIS — M5416 Radiculopathy, lumbar region: Secondary | ICD-10-CM | POA: Diagnosis not present

## 2019-05-29 ENCOUNTER — Ambulatory Visit: Payer: PPO

## 2019-05-29 DIAGNOSIS — U071 COVID-19: Secondary | ICD-10-CM | POA: Diagnosis not present

## 2019-05-30 DIAGNOSIS — J069 Acute upper respiratory infection, unspecified: Secondary | ICD-10-CM | POA: Diagnosis not present

## 2019-05-30 DIAGNOSIS — U071 COVID-19: Secondary | ICD-10-CM | POA: Diagnosis not present

## 2019-05-30 DIAGNOSIS — J208 Acute bronchitis due to other specified organisms: Secondary | ICD-10-CM | POA: Diagnosis not present

## 2019-06-03 DIAGNOSIS — U071 COVID-19: Secondary | ICD-10-CM | POA: Diagnosis not present

## 2019-06-03 DIAGNOSIS — J208 Acute bronchitis due to other specified organisms: Secondary | ICD-10-CM | POA: Diagnosis not present

## 2019-06-05 DIAGNOSIS — J208 Acute bronchitis due to other specified organisms: Secondary | ICD-10-CM | POA: Diagnosis not present

## 2019-06-05 DIAGNOSIS — U071 COVID-19: Secondary | ICD-10-CM | POA: Diagnosis not present

## 2019-06-10 DIAGNOSIS — J1289 Other viral pneumonia: Secondary | ICD-10-CM | POA: Diagnosis not present

## 2019-06-10 DIAGNOSIS — U071 COVID-19: Secondary | ICD-10-CM | POA: Diagnosis not present

## 2019-06-10 DIAGNOSIS — J1282 Pneumonia due to coronavirus disease 2019: Secondary | ICD-10-CM | POA: Diagnosis not present

## 2019-06-12 DIAGNOSIS — U071 COVID-19: Secondary | ICD-10-CM | POA: Diagnosis not present

## 2019-06-12 DIAGNOSIS — J1282 Pneumonia due to coronavirus disease 2019: Secondary | ICD-10-CM | POA: Diagnosis not present

## 2019-06-16 DIAGNOSIS — U071 COVID-19: Secondary | ICD-10-CM | POA: Diagnosis not present

## 2019-06-16 DIAGNOSIS — J1282 Pneumonia due to coronavirus disease 2019: Secondary | ICD-10-CM | POA: Diagnosis not present

## 2019-06-18 DIAGNOSIS — J1282 Pneumonia due to coronavirus disease 2019: Secondary | ICD-10-CM | POA: Diagnosis not present

## 2019-06-18 DIAGNOSIS — U071 COVID-19: Secondary | ICD-10-CM | POA: Diagnosis not present

## 2019-06-20 ENCOUNTER — Ambulatory Visit
Admission: RE | Admit: 2019-06-20 | Discharge: 2019-06-20 | Disposition: A | Discharge status: Home / Self Care | Payer: PPO | Source: Ambulatory Visit | Attending: Internal Medicine | Admitting: Internal Medicine

## 2019-06-20 ENCOUNTER — Other Ambulatory Visit: Payer: Self-pay

## 2019-06-20 DIAGNOSIS — M5440 Lumbago with sciatica, unspecified side: Secondary | ICD-10-CM | POA: Insufficient documentation

## 2019-06-20 DIAGNOSIS — M545 Low back pain: Secondary | ICD-10-CM | POA: Diagnosis not present

## 2019-06-26 ENCOUNTER — Other Ambulatory Visit: Payer: Self-pay | Admitting: Neurosurgery

## 2019-06-26 ENCOUNTER — Ambulatory Visit
Admission: RE | Admit: 2019-06-26 | Discharge: 2019-06-26 | Disposition: A | Discharge status: Home / Self Care | Payer: PPO | Source: Ambulatory Visit | Attending: Neurosurgery | Admitting: Neurosurgery

## 2019-06-26 DIAGNOSIS — M546 Pain in thoracic spine: Secondary | ICD-10-CM | POA: Diagnosis not present

## 2019-06-26 DIAGNOSIS — M419 Scoliosis, unspecified: Secondary | ICD-10-CM

## 2019-06-26 DIAGNOSIS — G8929 Other chronic pain: Secondary | ICD-10-CM | POA: Diagnosis not present

## 2019-06-26 DIAGNOSIS — R252 Cramp and spasm: Secondary | ICD-10-CM | POA: Diagnosis not present

## 2019-06-26 DIAGNOSIS — M4186 Other forms of scoliosis, lumbar region: Secondary | ICD-10-CM | POA: Diagnosis not present

## 2019-07-02 DIAGNOSIS — U071 COVID-19: Secondary | ICD-10-CM | POA: Diagnosis not present

## 2019-07-02 DIAGNOSIS — J1282 Pneumonia due to coronavirus disease 2019: Secondary | ICD-10-CM | POA: Diagnosis not present

## 2019-07-02 DIAGNOSIS — M5116 Intervertebral disc disorders with radiculopathy, lumbar region: Secondary | ICD-10-CM | POA: Diagnosis not present

## 2019-07-07 DIAGNOSIS — M9902 Segmental and somatic dysfunction of thoracic region: Secondary | ICD-10-CM | POA: Diagnosis not present

## 2019-07-07 DIAGNOSIS — M546 Pain in thoracic spine: Secondary | ICD-10-CM | POA: Diagnosis not present

## 2019-07-07 DIAGNOSIS — M9904 Segmental and somatic dysfunction of sacral region: Secondary | ICD-10-CM | POA: Diagnosis not present

## 2019-07-07 DIAGNOSIS — M461 Sacroiliitis, not elsewhere classified: Secondary | ICD-10-CM | POA: Diagnosis not present

## 2019-07-07 DIAGNOSIS — M5414 Radiculopathy, thoracic region: Secondary | ICD-10-CM | POA: Diagnosis not present

## 2019-07-07 DIAGNOSIS — M9903 Segmental and somatic dysfunction of lumbar region: Secondary | ICD-10-CM | POA: Diagnosis not present

## 2019-07-07 DIAGNOSIS — M62838 Other muscle spasm: Secondary | ICD-10-CM | POA: Diagnosis not present

## 2019-07-08 DIAGNOSIS — M9902 Segmental and somatic dysfunction of thoracic region: Secondary | ICD-10-CM | POA: Diagnosis not present

## 2019-07-08 DIAGNOSIS — M9903 Segmental and somatic dysfunction of lumbar region: Secondary | ICD-10-CM | POA: Diagnosis not present

## 2019-07-08 DIAGNOSIS — M5414 Radiculopathy, thoracic region: Secondary | ICD-10-CM | POA: Diagnosis not present

## 2019-07-08 DIAGNOSIS — M546 Pain in thoracic spine: Secondary | ICD-10-CM | POA: Diagnosis not present

## 2019-07-08 DIAGNOSIS — M461 Sacroiliitis, not elsewhere classified: Secondary | ICD-10-CM | POA: Diagnosis not present

## 2019-07-08 DIAGNOSIS — M62838 Other muscle spasm: Secondary | ICD-10-CM | POA: Diagnosis not present

## 2019-07-08 DIAGNOSIS — M9904 Segmental and somatic dysfunction of sacral region: Secondary | ICD-10-CM | POA: Diagnosis not present

## 2019-07-10 DIAGNOSIS — M461 Sacroiliitis, not elsewhere classified: Secondary | ICD-10-CM | POA: Diagnosis not present

## 2019-07-10 DIAGNOSIS — M5414 Radiculopathy, thoracic region: Secondary | ICD-10-CM | POA: Diagnosis not present

## 2019-07-10 DIAGNOSIS — M546 Pain in thoracic spine: Secondary | ICD-10-CM | POA: Diagnosis not present

## 2019-07-10 DIAGNOSIS — M9903 Segmental and somatic dysfunction of lumbar region: Secondary | ICD-10-CM | POA: Diagnosis not present

## 2019-07-10 DIAGNOSIS — M9902 Segmental and somatic dysfunction of thoracic region: Secondary | ICD-10-CM | POA: Diagnosis not present

## 2019-07-10 DIAGNOSIS — M9904 Segmental and somatic dysfunction of sacral region: Secondary | ICD-10-CM | POA: Diagnosis not present

## 2019-07-10 DIAGNOSIS — M62838 Other muscle spasm: Secondary | ICD-10-CM | POA: Diagnosis not present

## 2019-07-14 DIAGNOSIS — M9902 Segmental and somatic dysfunction of thoracic region: Secondary | ICD-10-CM | POA: Diagnosis not present

## 2019-07-14 DIAGNOSIS — M9904 Segmental and somatic dysfunction of sacral region: Secondary | ICD-10-CM | POA: Diagnosis not present

## 2019-07-14 DIAGNOSIS — M5414 Radiculopathy, thoracic region: Secondary | ICD-10-CM | POA: Diagnosis not present

## 2019-07-14 DIAGNOSIS — M546 Pain in thoracic spine: Secondary | ICD-10-CM | POA: Diagnosis not present

## 2019-07-14 DIAGNOSIS — M9903 Segmental and somatic dysfunction of lumbar region: Secondary | ICD-10-CM | POA: Diagnosis not present

## 2019-07-14 DIAGNOSIS — M461 Sacroiliitis, not elsewhere classified: Secondary | ICD-10-CM | POA: Diagnosis not present

## 2019-07-14 DIAGNOSIS — M62838 Other muscle spasm: Secondary | ICD-10-CM | POA: Diagnosis not present

## 2019-07-17 DIAGNOSIS — M461 Sacroiliitis, not elsewhere classified: Secondary | ICD-10-CM | POA: Diagnosis not present

## 2019-07-17 DIAGNOSIS — M62838 Other muscle spasm: Secondary | ICD-10-CM | POA: Diagnosis not present

## 2019-07-17 DIAGNOSIS — M9904 Segmental and somatic dysfunction of sacral region: Secondary | ICD-10-CM | POA: Diagnosis not present

## 2019-07-17 DIAGNOSIS — M546 Pain in thoracic spine: Secondary | ICD-10-CM | POA: Diagnosis not present

## 2019-07-17 DIAGNOSIS — M9903 Segmental and somatic dysfunction of lumbar region: Secondary | ICD-10-CM | POA: Diagnosis not present

## 2019-07-17 DIAGNOSIS — M9902 Segmental and somatic dysfunction of thoracic region: Secondary | ICD-10-CM | POA: Diagnosis not present

## 2019-07-17 DIAGNOSIS — M5414 Radiculopathy, thoracic region: Secondary | ICD-10-CM | POA: Diagnosis not present

## 2019-07-22 DIAGNOSIS — M5414 Radiculopathy, thoracic region: Secondary | ICD-10-CM | POA: Diagnosis not present

## 2019-07-22 DIAGNOSIS — M546 Pain in thoracic spine: Secondary | ICD-10-CM | POA: Diagnosis not present

## 2019-07-22 DIAGNOSIS — M461 Sacroiliitis, not elsewhere classified: Secondary | ICD-10-CM | POA: Diagnosis not present

## 2019-07-22 DIAGNOSIS — M9904 Segmental and somatic dysfunction of sacral region: Secondary | ICD-10-CM | POA: Diagnosis not present

## 2019-07-22 DIAGNOSIS — M9902 Segmental and somatic dysfunction of thoracic region: Secondary | ICD-10-CM | POA: Diagnosis not present

## 2019-07-22 DIAGNOSIS — M9903 Segmental and somatic dysfunction of lumbar region: Secondary | ICD-10-CM | POA: Diagnosis not present

## 2019-07-22 DIAGNOSIS — M62838 Other muscle spasm: Secondary | ICD-10-CM | POA: Diagnosis not present

## 2019-07-24 DIAGNOSIS — M9902 Segmental and somatic dysfunction of thoracic region: Secondary | ICD-10-CM | POA: Diagnosis not present

## 2019-07-24 DIAGNOSIS — M9903 Segmental and somatic dysfunction of lumbar region: Secondary | ICD-10-CM | POA: Diagnosis not present

## 2019-07-24 DIAGNOSIS — M546 Pain in thoracic spine: Secondary | ICD-10-CM | POA: Diagnosis not present

## 2019-07-24 DIAGNOSIS — M461 Sacroiliitis, not elsewhere classified: Secondary | ICD-10-CM | POA: Diagnosis not present

## 2019-07-24 DIAGNOSIS — M5414 Radiculopathy, thoracic region: Secondary | ICD-10-CM | POA: Diagnosis not present

## 2019-07-24 DIAGNOSIS — M9904 Segmental and somatic dysfunction of sacral region: Secondary | ICD-10-CM | POA: Diagnosis not present

## 2019-07-24 DIAGNOSIS — M62838 Other muscle spasm: Secondary | ICD-10-CM | POA: Diagnosis not present

## 2019-07-28 DIAGNOSIS — F0390 Unspecified dementia without behavioral disturbance: Secondary | ICD-10-CM | POA: Diagnosis not present

## 2019-07-28 DIAGNOSIS — G933 Postviral fatigue syndrome: Secondary | ICD-10-CM | POA: Diagnosis not present

## 2019-07-28 DIAGNOSIS — G301 Alzheimer's disease with late onset: Secondary | ICD-10-CM | POA: Diagnosis not present

## 2019-07-28 DIAGNOSIS — F0280 Dementia in other diseases classified elsewhere without behavioral disturbance: Secondary | ICD-10-CM | POA: Insufficient documentation

## 2019-07-28 DIAGNOSIS — M4126 Other idiopathic scoliosis, lumbar region: Secondary | ICD-10-CM | POA: Diagnosis not present

## 2019-07-28 DIAGNOSIS — J1282 Pneumonia due to coronavirus disease 2019: Secondary | ICD-10-CM | POA: Diagnosis not present

## 2019-07-28 DIAGNOSIS — Z0000 Encounter for general adult medical examination without abnormal findings: Secondary | ICD-10-CM | POA: Diagnosis not present

## 2019-07-28 DIAGNOSIS — U071 COVID-19: Secondary | ICD-10-CM | POA: Diagnosis not present

## 2019-07-28 DIAGNOSIS — R739 Hyperglycemia, unspecified: Secondary | ICD-10-CM | POA: Diagnosis not present

## 2019-07-29 DIAGNOSIS — M546 Pain in thoracic spine: Secondary | ICD-10-CM | POA: Diagnosis not present

## 2019-07-29 DIAGNOSIS — M9902 Segmental and somatic dysfunction of thoracic region: Secondary | ICD-10-CM | POA: Diagnosis not present

## 2019-07-29 DIAGNOSIS — M9904 Segmental and somatic dysfunction of sacral region: Secondary | ICD-10-CM | POA: Diagnosis not present

## 2019-07-29 DIAGNOSIS — M5414 Radiculopathy, thoracic region: Secondary | ICD-10-CM | POA: Diagnosis not present

## 2019-07-29 DIAGNOSIS — M62838 Other muscle spasm: Secondary | ICD-10-CM | POA: Diagnosis not present

## 2019-07-29 DIAGNOSIS — M461 Sacroiliitis, not elsewhere classified: Secondary | ICD-10-CM | POA: Diagnosis not present

## 2019-07-29 DIAGNOSIS — M9903 Segmental and somatic dysfunction of lumbar region: Secondary | ICD-10-CM | POA: Diagnosis not present

## 2019-08-12 DIAGNOSIS — M461 Sacroiliitis, not elsewhere classified: Secondary | ICD-10-CM | POA: Diagnosis not present

## 2019-08-12 DIAGNOSIS — M62838 Other muscle spasm: Secondary | ICD-10-CM | POA: Diagnosis not present

## 2019-08-12 DIAGNOSIS — M9904 Segmental and somatic dysfunction of sacral region: Secondary | ICD-10-CM | POA: Diagnosis not present

## 2019-08-12 DIAGNOSIS — M546 Pain in thoracic spine: Secondary | ICD-10-CM | POA: Diagnosis not present

## 2019-08-12 DIAGNOSIS — M5414 Radiculopathy, thoracic region: Secondary | ICD-10-CM | POA: Diagnosis not present

## 2019-08-12 DIAGNOSIS — M9902 Segmental and somatic dysfunction of thoracic region: Secondary | ICD-10-CM | POA: Diagnosis not present

## 2019-08-12 DIAGNOSIS — M9903 Segmental and somatic dysfunction of lumbar region: Secondary | ICD-10-CM | POA: Diagnosis not present

## 2019-08-14 DIAGNOSIS — R252 Cramp and spasm: Secondary | ICD-10-CM | POA: Diagnosis not present

## 2019-08-14 DIAGNOSIS — M419 Scoliosis, unspecified: Secondary | ICD-10-CM | POA: Diagnosis not present

## 2019-09-03 DIAGNOSIS — R296 Repeated falls: Secondary | ICD-10-CM | POA: Diagnosis not present

## 2019-09-03 DIAGNOSIS — R252 Cramp and spasm: Secondary | ICD-10-CM | POA: Diagnosis not present

## 2019-09-03 DIAGNOSIS — S0990XD Unspecified injury of head, subsequent encounter: Secondary | ICD-10-CM | POA: Diagnosis not present

## 2019-09-03 DIAGNOSIS — E519 Thiamine deficiency, unspecified: Secondary | ICD-10-CM | POA: Diagnosis not present

## 2019-09-03 DIAGNOSIS — E531 Pyridoxine deficiency: Secondary | ICD-10-CM | POA: Diagnosis not present

## 2019-09-03 DIAGNOSIS — E538 Deficiency of other specified B group vitamins: Secondary | ICD-10-CM | POA: Diagnosis not present

## 2019-09-03 DIAGNOSIS — M792 Neuralgia and neuritis, unspecified: Secondary | ICD-10-CM | POA: Diagnosis not present

## 2019-09-03 DIAGNOSIS — E559 Vitamin D deficiency, unspecified: Secondary | ICD-10-CM | POA: Diagnosis not present

## 2019-09-03 DIAGNOSIS — R4189 Other symptoms and signs involving cognitive functions and awareness: Secondary | ICD-10-CM | POA: Diagnosis not present

## 2019-09-04 ENCOUNTER — Other Ambulatory Visit: Payer: Self-pay | Admitting: Neurology

## 2019-09-04 DIAGNOSIS — R296 Repeated falls: Secondary | ICD-10-CM

## 2019-09-09 DIAGNOSIS — K219 Gastro-esophageal reflux disease without esophagitis: Secondary | ICD-10-CM | POA: Insufficient documentation

## 2019-09-18 ENCOUNTER — Ambulatory Visit: Payer: PPO

## 2019-09-22 DIAGNOSIS — M5416 Radiculopathy, lumbar region: Secondary | ICD-10-CM | POA: Diagnosis not present

## 2019-09-22 DIAGNOSIS — R600 Localized edema: Secondary | ICD-10-CM | POA: Diagnosis not present

## 2020-02-26 DIAGNOSIS — E538 Deficiency of other specified B group vitamins: Secondary | ICD-10-CM | POA: Diagnosis not present

## 2020-02-26 DIAGNOSIS — E782 Mixed hyperlipidemia: Secondary | ICD-10-CM | POA: Diagnosis not present

## 2020-02-26 DIAGNOSIS — Z125 Encounter for screening for malignant neoplasm of prostate: Secondary | ICD-10-CM | POA: Diagnosis not present

## 2020-03-04 DIAGNOSIS — F4312 Post-traumatic stress disorder, chronic: Secondary | ICD-10-CM | POA: Insufficient documentation

## 2020-03-04 DIAGNOSIS — G301 Alzheimer's disease with late onset: Secondary | ICD-10-CM | POA: Diagnosis not present

## 2020-03-04 DIAGNOSIS — E782 Mixed hyperlipidemia: Secondary | ICD-10-CM | POA: Diagnosis not present

## 2020-03-04 DIAGNOSIS — F5104 Psychophysiologic insomnia: Secondary | ICD-10-CM | POA: Diagnosis not present

## 2020-03-04 DIAGNOSIS — F0280 Dementia in other diseases classified elsewhere without behavioral disturbance: Secondary | ICD-10-CM | POA: Diagnosis not present

## 2020-03-04 DIAGNOSIS — Z23 Encounter for immunization: Secondary | ICD-10-CM | POA: Diagnosis not present

## 2020-03-04 DIAGNOSIS — Z0000 Encounter for general adult medical examination without abnormal findings: Secondary | ICD-10-CM | POA: Diagnosis not present

## 2020-04-23 DIAGNOSIS — Z23 Encounter for immunization: Secondary | ICD-10-CM | POA: Diagnosis not present

## 2020-05-04 DIAGNOSIS — F4310 Post-traumatic stress disorder, unspecified: Secondary | ICD-10-CM | POA: Diagnosis not present

## 2020-05-04 DIAGNOSIS — M5416 Radiculopathy, lumbar region: Secondary | ICD-10-CM | POA: Diagnosis not present

## 2020-05-04 DIAGNOSIS — Z0000 Encounter for general adult medical examination without abnormal findings: Secondary | ICD-10-CM | POA: Diagnosis not present

## 2020-05-27 DIAGNOSIS — M9902 Segmental and somatic dysfunction of thoracic region: Secondary | ICD-10-CM | POA: Diagnosis not present

## 2020-05-27 DIAGNOSIS — M9903 Segmental and somatic dysfunction of lumbar region: Secondary | ICD-10-CM | POA: Diagnosis not present

## 2020-05-27 DIAGNOSIS — M546 Pain in thoracic spine: Secondary | ICD-10-CM | POA: Diagnosis not present

## 2020-05-27 DIAGNOSIS — M25551 Pain in right hip: Secondary | ICD-10-CM | POA: Diagnosis not present

## 2020-05-27 DIAGNOSIS — M5451 Vertebrogenic low back pain: Secondary | ICD-10-CM | POA: Diagnosis not present

## 2020-05-27 DIAGNOSIS — M62838 Other muscle spasm: Secondary | ICD-10-CM | POA: Diagnosis not present

## 2020-06-01 DIAGNOSIS — M9902 Segmental and somatic dysfunction of thoracic region: Secondary | ICD-10-CM | POA: Diagnosis not present

## 2020-06-01 DIAGNOSIS — M5451 Vertebrogenic low back pain: Secondary | ICD-10-CM | POA: Diagnosis not present

## 2020-06-01 DIAGNOSIS — M62838 Other muscle spasm: Secondary | ICD-10-CM | POA: Diagnosis not present

## 2020-06-01 DIAGNOSIS — M9903 Segmental and somatic dysfunction of lumbar region: Secondary | ICD-10-CM | POA: Diagnosis not present

## 2020-06-01 DIAGNOSIS — M546 Pain in thoracic spine: Secondary | ICD-10-CM | POA: Diagnosis not present

## 2020-06-01 DIAGNOSIS — M25551 Pain in right hip: Secondary | ICD-10-CM | POA: Diagnosis not present

## 2020-06-03 DIAGNOSIS — M25551 Pain in right hip: Secondary | ICD-10-CM | POA: Diagnosis not present

## 2020-06-03 DIAGNOSIS — M9902 Segmental and somatic dysfunction of thoracic region: Secondary | ICD-10-CM | POA: Diagnosis not present

## 2020-06-03 DIAGNOSIS — M9903 Segmental and somatic dysfunction of lumbar region: Secondary | ICD-10-CM | POA: Diagnosis not present

## 2020-06-03 DIAGNOSIS — M546 Pain in thoracic spine: Secondary | ICD-10-CM | POA: Diagnosis not present

## 2020-06-03 DIAGNOSIS — M62838 Other muscle spasm: Secondary | ICD-10-CM | POA: Diagnosis not present

## 2020-06-03 DIAGNOSIS — M5451 Vertebrogenic low back pain: Secondary | ICD-10-CM | POA: Diagnosis not present

## 2020-06-08 DIAGNOSIS — M25551 Pain in right hip: Secondary | ICD-10-CM | POA: Diagnosis not present

## 2020-06-08 DIAGNOSIS — M9903 Segmental and somatic dysfunction of lumbar region: Secondary | ICD-10-CM | POA: Diagnosis not present

## 2020-06-08 DIAGNOSIS — M62838 Other muscle spasm: Secondary | ICD-10-CM | POA: Diagnosis not present

## 2020-06-08 DIAGNOSIS — M546 Pain in thoracic spine: Secondary | ICD-10-CM | POA: Diagnosis not present

## 2020-06-08 DIAGNOSIS — M9902 Segmental and somatic dysfunction of thoracic region: Secondary | ICD-10-CM | POA: Diagnosis not present

## 2020-06-08 DIAGNOSIS — M5451 Vertebrogenic low back pain: Secondary | ICD-10-CM | POA: Diagnosis not present

## 2020-06-14 DIAGNOSIS — Z86010 Personal history of colonic polyps: Secondary | ICD-10-CM | POA: Diagnosis not present

## 2020-06-14 DIAGNOSIS — K219 Gastro-esophageal reflux disease without esophagitis: Secondary | ICD-10-CM | POA: Diagnosis not present

## 2020-06-17 DIAGNOSIS — M9902 Segmental and somatic dysfunction of thoracic region: Secondary | ICD-10-CM | POA: Diagnosis not present

## 2020-06-17 DIAGNOSIS — M546 Pain in thoracic spine: Secondary | ICD-10-CM | POA: Diagnosis not present

## 2020-06-17 DIAGNOSIS — M5451 Vertebrogenic low back pain: Secondary | ICD-10-CM | POA: Diagnosis not present

## 2020-06-17 DIAGNOSIS — M62838 Other muscle spasm: Secondary | ICD-10-CM | POA: Diagnosis not present

## 2020-06-17 DIAGNOSIS — M9903 Segmental and somatic dysfunction of lumbar region: Secondary | ICD-10-CM | POA: Diagnosis not present

## 2020-06-17 DIAGNOSIS — M25551 Pain in right hip: Secondary | ICD-10-CM | POA: Diagnosis not present

## 2020-06-22 DIAGNOSIS — M5451 Vertebrogenic low back pain: Secondary | ICD-10-CM | POA: Diagnosis not present

## 2020-06-22 DIAGNOSIS — M25551 Pain in right hip: Secondary | ICD-10-CM | POA: Diagnosis not present

## 2020-06-22 DIAGNOSIS — M9902 Segmental and somatic dysfunction of thoracic region: Secondary | ICD-10-CM | POA: Diagnosis not present

## 2020-06-22 DIAGNOSIS — M9903 Segmental and somatic dysfunction of lumbar region: Secondary | ICD-10-CM | POA: Diagnosis not present

## 2020-06-22 DIAGNOSIS — M546 Pain in thoracic spine: Secondary | ICD-10-CM | POA: Diagnosis not present

## 2020-06-22 DIAGNOSIS — M62838 Other muscle spasm: Secondary | ICD-10-CM | POA: Diagnosis not present

## 2020-07-08 DIAGNOSIS — Z20822 Contact with and (suspected) exposure to covid-19: Secondary | ICD-10-CM | POA: Diagnosis not present

## 2020-07-08 DIAGNOSIS — B9789 Other viral agents as the cause of diseases classified elsewhere: Secondary | ICD-10-CM | POA: Diagnosis not present

## 2020-07-08 DIAGNOSIS — J028 Acute pharyngitis due to other specified organisms: Secondary | ICD-10-CM | POA: Diagnosis not present

## 2020-07-08 DIAGNOSIS — R519 Headache, unspecified: Secondary | ICD-10-CM | POA: Diagnosis not present

## 2020-07-08 DIAGNOSIS — F419 Anxiety disorder, unspecified: Secondary | ICD-10-CM | POA: Diagnosis not present

## 2020-07-15 DIAGNOSIS — M5451 Vertebrogenic low back pain: Secondary | ICD-10-CM | POA: Diagnosis not present

## 2020-07-15 DIAGNOSIS — M62838 Other muscle spasm: Secondary | ICD-10-CM | POA: Diagnosis not present

## 2020-07-15 DIAGNOSIS — M9902 Segmental and somatic dysfunction of thoracic region: Secondary | ICD-10-CM | POA: Diagnosis not present

## 2020-07-15 DIAGNOSIS — M9903 Segmental and somatic dysfunction of lumbar region: Secondary | ICD-10-CM | POA: Diagnosis not present

## 2020-07-15 DIAGNOSIS — M546 Pain in thoracic spine: Secondary | ICD-10-CM | POA: Diagnosis not present

## 2020-07-15 DIAGNOSIS — M25551 Pain in right hip: Secondary | ICD-10-CM | POA: Diagnosis not present

## 2020-08-16 ENCOUNTER — Other Ambulatory Visit: Admission: RE | Admit: 2020-08-16 | Payer: PPO | Source: Ambulatory Visit

## 2020-08-16 DIAGNOSIS — J40 Bronchitis, not specified as acute or chronic: Secondary | ICD-10-CM | POA: Diagnosis not present

## 2020-08-16 DIAGNOSIS — J45902 Unspecified asthma with status asthmaticus: Secondary | ICD-10-CM | POA: Diagnosis not present

## 2020-08-16 DIAGNOSIS — B349 Viral infection, unspecified: Secondary | ICD-10-CM | POA: Diagnosis not present

## 2020-08-18 ENCOUNTER — Encounter: Admission: RE | Payer: Self-pay | Source: Home / Self Care

## 2020-08-18 ENCOUNTER — Ambulatory Visit: Admission: RE | Admit: 2020-08-18 | Payer: PPO | Source: Home / Self Care | Admitting: Internal Medicine

## 2020-08-18 SURGERY — COLONOSCOPY WITH PROPOFOL
Anesthesia: General

## 2020-09-07 DIAGNOSIS — G4733 Obstructive sleep apnea (adult) (pediatric): Secondary | ICD-10-CM | POA: Diagnosis not present

## 2020-09-07 DIAGNOSIS — E782 Mixed hyperlipidemia: Secondary | ICD-10-CM | POA: Diagnosis not present

## 2020-09-07 DIAGNOSIS — G301 Alzheimer's disease with late onset: Secondary | ICD-10-CM | POA: Diagnosis not present

## 2020-09-07 DIAGNOSIS — F0280 Dementia in other diseases classified elsewhere without behavioral disturbance: Secondary | ICD-10-CM | POA: Diagnosis not present

## 2020-09-07 DIAGNOSIS — E538 Deficiency of other specified B group vitamins: Secondary | ICD-10-CM | POA: Diagnosis not present

## 2020-09-07 DIAGNOSIS — Z125 Encounter for screening for malignant neoplasm of prostate: Secondary | ICD-10-CM | POA: Diagnosis not present

## 2020-09-07 DIAGNOSIS — Z0000 Encounter for general adult medical examination without abnormal findings: Secondary | ICD-10-CM | POA: Diagnosis not present

## 2020-09-07 DIAGNOSIS — M4126 Other idiopathic scoliosis, lumbar region: Secondary | ICD-10-CM | POA: Diagnosis not present

## 2020-10-19 ENCOUNTER — Encounter: Payer: Self-pay | Admitting: Internal Medicine

## 2020-10-20 ENCOUNTER — Ambulatory Visit
Admission: RE | Admit: 2020-10-20 | Discharge: 2020-10-20 | Disposition: A | Discharge status: Home / Self Care | Payer: PPO | Attending: Internal Medicine | Admitting: Internal Medicine

## 2020-10-20 ENCOUNTER — Encounter
Admission: RE | Disposition: A | Discharge status: Home / Self Care | Payer: Self-pay | Source: Home / Self Care | Attending: Internal Medicine

## 2020-10-20 ENCOUNTER — Ambulatory Visit: Payer: PPO | Admitting: Certified Registered Nurse Anesthetist

## 2020-10-20 DIAGNOSIS — Z1211 Encounter for screening for malignant neoplasm of colon: Secondary | ICD-10-CM | POA: Insufficient documentation

## 2020-10-20 DIAGNOSIS — Z881 Allergy status to other antibiotic agents status: Secondary | ICD-10-CM | POA: Diagnosis not present

## 2020-10-20 DIAGNOSIS — Z880 Allergy status to penicillin: Secondary | ICD-10-CM | POA: Insufficient documentation

## 2020-10-20 DIAGNOSIS — Z86010 Personal history of colonic polyps: Secondary | ICD-10-CM | POA: Diagnosis not present

## 2020-10-20 DIAGNOSIS — R1310 Dysphagia, unspecified: Secondary | ICD-10-CM | POA: Insufficient documentation

## 2020-10-20 DIAGNOSIS — Z79899 Other long term (current) drug therapy: Secondary | ICD-10-CM | POA: Insufficient documentation

## 2020-10-20 DIAGNOSIS — K229 Disease of esophagus, unspecified: Secondary | ICD-10-CM | POA: Insufficient documentation

## 2020-10-20 DIAGNOSIS — K640 First degree hemorrhoids: Secondary | ICD-10-CM | POA: Insufficient documentation

## 2020-10-20 DIAGNOSIS — Z791 Long term (current) use of non-steroidal anti-inflammatories (NSAID): Secondary | ICD-10-CM | POA: Diagnosis not present

## 2020-10-20 DIAGNOSIS — K648 Other hemorrhoids: Secondary | ICD-10-CM | POA: Diagnosis not present

## 2020-10-20 DIAGNOSIS — K5730 Diverticulosis of large intestine without perforation or abscess without bleeding: Secondary | ICD-10-CM | POA: Insufficient documentation

## 2020-10-20 DIAGNOSIS — K5790 Diverticulosis of intestine, part unspecified, without perforation or abscess without bleeding: Secondary | ICD-10-CM | POA: Diagnosis not present

## 2020-10-20 HISTORY — PX: ESOPHAGOGASTRODUODENOSCOPY (EGD) WITH PROPOFOL: SHX5813

## 2020-10-20 HISTORY — PX: COLONOSCOPY WITH PROPOFOL: SHX5780

## 2020-10-20 LAB — KOH PREP: Special Requests: NORMAL

## 2020-10-20 SURGERY — COLONOSCOPY WITH PROPOFOL
Anesthesia: General

## 2020-10-20 MED ORDER — PROPOFOL 500 MG/50ML IV EMUL
INTRAVENOUS | Status: AC
  Filled 2020-10-20: qty 50

## 2020-10-20 MED ORDER — GLYCOPYRROLATE 0.2 MG/ML IJ SOLN
INTRAMUSCULAR | Status: DC | PRN
  Administered 2020-10-20: .2 mg via INTRAVENOUS

## 2020-10-20 MED ORDER — PROPOFOL 10 MG/ML IV BOLUS
INTRAVENOUS | Status: DC | PRN
  Administered 2020-10-20: 60 mg via INTRAVENOUS

## 2020-10-20 MED ORDER — LIDOCAINE HCL (CARDIAC) PF 100 MG/5ML IV SOSY
PREFILLED_SYRINGE | INTRAVENOUS | Status: DC | PRN
  Administered 2020-10-20: 100 mg via INTRAVENOUS

## 2020-10-20 MED ORDER — PROPOFOL 500 MG/50ML IV EMUL
INTRAVENOUS | Status: DC | PRN
  Administered 2020-10-20: 150 ug/kg/min via INTRAVENOUS

## 2020-10-20 MED ORDER — SODIUM CHLORIDE 0.9 % IV SOLN
INTRAVENOUS | Status: DC
  Administered 2020-10-20: 20 mL/h via INTRAVENOUS

## 2020-10-20 NOTE — Anesthesia Preprocedure Evaluation (Signed)
Anesthesia Evaluation  Patient identified by MRN, date of birth, ID band Patient awake    Reviewed: Allergy & Precautions, NPO status , Patient's Chart, lab work & pertinent test results  History of Anesthesia Complications Negative for: history of anesthetic complications  Airway Mallampati: II       Dental   Pulmonary neg sleep apnea, neg COPD, Not current smoker, former smoker,           Cardiovascular (-) hypertension(-) Past MI and (-) CHF (-) dysrhythmias (-) Valvular Problems/Murmurs     Neuro/Psych neg Seizures    GI/Hepatic Neg liver ROS, GERD  Medicated and Controlled,  Endo/Other  neg diabetes  Renal/GU negative Renal ROS     Musculoskeletal   Abdominal   Peds  Hematology   Anesthesia Other Findings   Reproductive/Obstetrics                             Anesthesia Physical Anesthesia Plan  ASA: II  Anesthesia Plan: General   Post-op Pain Management:    Induction: Intravenous  PONV Risk Score and Plan: 2 and Propofol infusion and TIVA  Airway Management Planned:   Additional Equipment:   Intra-op Plan:   Post-operative Plan:   Informed Consent: I have reviewed the patients History and Physical, chart, labs and discussed the procedure including the risks, benefits and alternatives for the proposed anesthesia with the patient or authorized representative who has indicated his/her understanding and acceptance.       Plan Discussed with:   Anesthesia Plan Comments:         Anesthesia Quick Evaluation

## 2020-10-20 NOTE — H&P (Signed)
Outpatient short stay form Pre-procedure 10/20/2020 10:25 AM Jearldean Gutt K. Alice Reichert, M.D.  Primary Physician: Emily Filbert, M.D.  Reason for visit:  Personal history of colon polyps, Dysphagia (chronic, intermittent).   History of present illness:                            Patient presents for colonoscopy for a personal hx of colon polyps. The patient denies abdominal pain, abnormal weight loss or rectal bleeding.  Patient with dysphagia to solids and liquids intermittently, localized to the base of the neck. No weight loss, hemetemesis, anorexia, abdominal pain, change in bowel habits. Patient reports that previous esophageal dilations were helpful to swallowing in the past.    Current Facility-Administered Medications:  .  0.9 %  sodium chloride infusion, , Intravenous, Continuous, Bartlett, Benay Pike, MD, Last Rate: 20 mL/hr at 10/20/20 1007, Continued from Pre-op at 10/20/20 1007  Medications Prior to Admission  Medication Sig Dispense Refill Last Dose  . acetaminophen (TYLENOL) 325 MG tablet Take 650 mg by mouth 3 (three) times daily as needed.   10/19/2020 at Unknown time  . cholecalciferol (VITAMIN D3) 10 MCG (400 UNIT) TABS tablet Take 1,000 Units by mouth.   Past Week at Unknown time  . donepezil (ARICEPT) 10 MG tablet Take 10 mg by mouth at bedtime.   10/19/2020 at Unknown time  . famotidine (PEPCID) 40 MG tablet Take 40 mg by mouth daily.   10/19/2020 at Unknown time  . galantamine (RAZADYNE) 8 MG tablet Take 4 mg by mouth 2 (two) times daily with a meal.   10/19/2020 at Unknown time  . hydrochlorothiazide (MICROZIDE) 12.5 MG capsule Take 12.5 mg by mouth daily.   10/19/2020 at Unknown time  . magnesium oxide (MAG-OX) 400 MG tablet Take 400 mg by mouth daily.   Past Week at Unknown time  . meloxicam (MOBIC) 7.5 MG tablet Take 7.5 mg by mouth daily.   10/19/2020 at Unknown time  . memantine (NAMENDA) 5 MG tablet Take 5 mg by mouth 2 (two) times daily.   10/19/2020 at Unknown time  .  pantoprazole (PROTONIX) 40 MG tablet Take 40 mg by mouth daily.   10/19/2020 at Unknown time  . tamsulosin (FLOMAX) 0.4 MG CAPS capsule Take 0.4 mg by mouth.   10/19/2020 at Unknown time  . tiZANidine (ZANAFLEX) 4 MG tablet Take 4 mg by mouth every 6 (six) hours as needed for muscle spasms.   10/19/2020 at Unknown time  . traMADol (ULTRAM) 50 MG tablet Take by mouth every 6 (six) hours as needed.   10/19/2020 at Unknown time  . venlafaxine (EFFEXOR) 37.5 MG tablet Take 37.5 mg by mouth 2 (two) times daily.   10/19/2020 at Unknown time  . vitamin B-12 (CYANOCOBALAMIN) 1000 MCG tablet Take 1,000 mcg by mouth daily.   Past Week at Unknown time     Allergies  Allergen Reactions  . Baclofen   . Penicillins      Past Medical History:  Diagnosis Date  . Colon polyps   . Diverticulosis   . GERD (gastroesophageal reflux disease)   . Hyperlipidemia   . Recurrent urticaria     Review of systems:  Otherwise negative.    Physical Exam  Gen: Alert, oriented. Appears stated age.  HEENT: [place]/AT. PERRLA. Lungs: CTA, no wheezes. CV: RR nl S1, S2. Abd: soft, benign, no masses. BS+ Ext: No edema. Pulses 2+    Planned procedures: Proceed with EGD  and colonoscopy. The patient understands the nature of the planned procedure, indications, risks, alternatives and potential complications including but not limited to bleeding, infection, perforation, damage to internal organs and possible oversedation/side effects from anesthesia. The patient agrees and gives consent to proceed.  Please refer to procedure notes for findings, recommendations and patient disposition/instructions.     Airiana Elman K. Alice Reichert, M.D. Gastroenterology 10/20/2020  10:25 AM

## 2020-10-20 NOTE — Interval H&P Note (Signed)
History and Physical Interval Note:  10/20/2020 10:29 AM  Jaquita Rector  has presented today for surgery, with the diagnosis of PRS HX COLON POLYPS DYSPHAGIA.  The various methods of treatment have been discussed with the patient and family. After consideration of risks, benefits and other options for treatment, the patient has consented to  Procedure(s): COLONOSCOPY WITH PROPOFOL (N/A) ESOPHAGOGASTRODUODENOSCOPY (EGD) WITH PROPOFOL (N/A) as a surgical intervention.  The patient's history has been reviewed, patient examined, no change in status, stable for surgery.  I have reviewed the patient's chart and labs.  Questions were answered to the patient's satisfaction.     [place], [place]

## 2020-10-20 NOTE — Transfer of Care (Signed)
Immediate Anesthesia Transfer of Care Note  Patient: Philip Brady  Procedure(s) Performed: COLONOSCOPY WITH PROPOFOL (N/A ) ESOPHAGOGASTRODUODENOSCOPY (EGD) WITH PROPOFOL (N/A )  Patient Location: Endoscopy Unit  Anesthesia Type:General  Level of Consciousness: drowsy  Airway & Oxygen Therapy: Patient Spontanous Breathing and Patient connected to face mask oxygen  Post-op Assessment: Report given to RN  Post vital signs: Reviewed and stable  Last Vitals:  Vitals Value Taken Time  BP 107/60   Temp    Pulse 75 10/20/20 1102  Resp 13 10/20/20 1102  SpO2 95 % 10/20/20 1102  Vitals shown include unvalidated device data.  Last Pain:  Vitals:   10/20/20 0948  TempSrc: Temporal  PainSc: 0-No pain         Complications: No complications documented.

## 2020-10-20 NOTE — Anesthesia Procedure Notes (Signed)
Date/Time: 10/20/2020 10:31 AM Performed by: Lily Peer, Maryiah Olvey, CRNA Pre-anesthesia Checklist: Patient identified, Emergency Drugs available, Suction available, Patient being monitored and Timeout performed Oxygen Delivery Method: Nasal cannula Induction Type: IV induction

## 2020-10-20 NOTE — Op Note (Signed)
[facility] Gastroenterology Patient Name: Philip Brady Procedure Date: 10/20/2020 10:26 AM MRN: [id number] Account #: [id number] Date of Birth: [birth year] Admit Type: Outpatient Age: 80 Room: [facility] ENDO ROOM 2 Gender: Male Note Status: Finalized Procedure:             Upper GI endoscopy Indications:           Dysphagia Providers:             Benay Pike. Alice Reichert MD, MD Referring MD:          Rusty Aus, MD (Referring MD) Medicines:             Propofol per Anesthesia Complications:         No immediate complications. Procedure:             Pre-Anesthesia Assessment:                        - The risks and benefits of the procedure and the                         sedation options and risks were discussed with the                         patient. All questions were answered and informed                         consent was obtained.                        - Patient identification and proposed procedure were                         verified prior to the procedure by the nurse. The                         procedure was verified in the procedure room.                        - ASA Grade Assessment: III - A patient with severe                         systemic disease.                        - After reviewing the risks and benefits, the patient                         was deemed in satisfactory condition to undergo the                         procedure.                        After obtaining informed consent, the endoscope was                         passed under direct vision. Throughout the procedure,                         the patient's blood pressure, pulse, and  oxygen                         saturations were monitored continuously. The Endoscope                         was introduced through the mouth, and advanced to the                         third part of duodenum. The upper GI endoscopy was                         accomplished without difficulty. The patient  tolerated                         the procedure well. Findings:      Patchy, white plaques were found in the mid esophagus. Cells for       cytology were obtained by brushing.      No endoscopic abnormality was evident in the esophagus to explain the       patient's complaint of dysphagia. It was decided, however, to proceed       with dilation in the distal esophagus. The scope was withdrawn. Dilation       was performed with a Maloney dilator with no resistance at 52 Fr.      The stomach was normal.      The examined duodenum was normal.      The exam was otherwise without abnormality. Impression:            - Esophageal plaques were found, suspicious for                         candidiasis. Cells for cytology obtained.                        - No endoscopic esophageal abnormality to explain                         patient's dysphagia. Esophagus dilated. Dilated.                        - Normal stomach.                        - Normal examined duodenum.                        - The examination was otherwise normal. Recommendation:        - Await pathology results from EGD, also performed                         today.                        - Monitor results to esophageal dilation                        - Proceed with colonoscopy Procedure Code(s):     --- Professional ---                        [id number], Esophagogastroduodenoscopy,  flexible,                         transoral; diagnostic, including collection of                         specimen(s) by brushing or washing, when performed                         (separate procedure)                        43450, Dilation of esophagus, by unguided sound or                         bougie, single or multiple passes Diagnosis Code(s):     --- Professional ---                        R13.10, Dysphagia, unspecified                        K22.9, Disease of esophagus, unspecified CPT copyright 2019 American Medical Association. All rights  reserved. The codes documented in this report are preliminary and upon coder review may  be revised to meet current compliance requirements. Efrain Sella MD, MD 10/20/2020 10:43:07 AM This report has been signed electronically. Number of Addenda: 0 Note Initiated On: 10/20/2020 10:26 AM Estimated Blood Loss:  Estimated blood loss: none.      [facility]

## 2020-10-20 NOTE — Anesthesia Postprocedure Evaluation (Signed)
Anesthesia Post Note  Patient: Philip Brady  Procedure(s) Performed: COLONOSCOPY WITH PROPOFOL (N/A ) ESOPHAGOGASTRODUODENOSCOPY (EGD) WITH PROPOFOL (N/A )  Patient location during evaluation: Endoscopy Anesthesia Type: General Level of consciousness: awake and alert Pain management: pain level controlled Vital Signs Assessment: post-procedure vital signs reviewed and stable Respiratory status: spontaneous breathing and respiratory function stable Cardiovascular status: stable Anesthetic complications: no   No complications documented.   Last Vitals:  Vitals:   10/20/20 0948 10/20/20 1101  BP: 136/61 107/60  Pulse: (!) 59   Resp: 20   Temp: (!) 35.8 C (!) 36.1 C  SpO2: 99%     Last Pain:  Vitals:   10/20/20 1131  TempSrc:   PainSc: 0-No pain                 Deaundra Dupriest K

## 2020-10-20 NOTE — Op Note (Signed)
[facility] Gastroenterology Patient Name: Philip Brady Procedure Date: 10/20/2020 10:26 AM MRN: [id number] Account #: [id number] Date of Birth: Oct 21, 1940 Admit Type: Outpatient Age: 80 Room: [facility] ENDO ROOM 2 Gender: Male Note Status: Finalized Procedure:             Colonoscopy Indications:           Surveillance: Personal history of colonic polyps                         (unknown histology) on last colonoscopy more than 5                         years ago, High risk colon cancer surveillance:                         Personal history of non-advanced adenoma Providers:             Jacalyn Biggs K. Alice Reichert MD, MD Referring MD:          Rusty Aus, MD (Referring MD) Medicines:             Propofol per Anesthesia Complications:         No immediate complications. Procedure:             Pre-Anesthesia Assessment:                        - The risks and benefits of the procedure and the                         sedation options and risks were discussed with the                         patient. All questions were answered and informed                         consent was obtained.                        - Patient identification and proposed procedure were                         verified prior to the procedure by the nurse. The                         procedure was verified in the procedure room.                        - ASA Grade Assessment: III - A patient with severe                         systemic disease.                        - After reviewing the risks and benefits, the patient                         was deemed in satisfactory condition to undergo the  procedure.                        After obtaining informed consent, the colonoscope was                         passed under direct vision. Throughout the procedure,                         the patient's blood pressure, pulse, and oxygen                         saturations were monitored  continuously. The                         Colonoscope was introduced through the anus and                         advanced to the the cecum, identified by appendiceal                         orifice and ileocecal valve. The colonoscopy was                         technically difficult and complex due to significant                         looping. Successful completion of the procedure was                         aided by changing the patient to a prone position and                         applying abdominal pressure. The patient tolerated the                         procedure well. The quality of the bowel preparation                         was adequate. The ileocecal valve, appendiceal                         orifice, and rectum were photographed. Findings:      The perianal and digital rectal examinations were normal. Pertinent       negatives include normal sphincter tone and no palpable rectal lesions.      Non-bleeding internal hemorrhoids were found during retroflexion. The       hemorrhoids were Grade I (internal hemorrhoids that do not prolapse).      Multiple small and large-mouthed diverticula were found in the entire       colon. There was no evidence of diverticular bleeding.      The exam was otherwise without abnormality. Impression:            - Non-bleeding internal hemorrhoids.                        - Moderate diverticulosis in the entire examined  colon. There was no evidence of diverticular bleeding.                        - The examination was otherwise normal.                        - No specimens collected. Recommendation:        - Await pathology results from EGD, also performed                         today.                        - Monitor results to esophageal dilation                        - Patient has a contact number available for                         emergencies. The signs and symptoms of potential                          delayed complications were discussed with the patient.                         Return to normal activities tomorrow. Written                         discharge instructions were provided to the patient.                        - Resume previous diet.                        - Continue present medications.                        - No repeat colonoscopy due to current age (10 years                         or older) and the absence of colonic polyps.                        - Return to physician assistant in 3 months.                        - Follow up with Octavia Bruckner, PA-C in [ ]  months.                        - The findings and recommendations were discussed with                         the patient. Procedure Code(s):     --- Professional ---                        G6269, Colorectal cancer screening; colonoscopy on                         individual at high risk Diagnosis Code(s):     ---  Professional ---                        K57.30, Diverticulosis of large intestine without                         perforation or abscess without bleeding                        Z86.010, Personal history of colonic polyps                        K64.0, First degree hemorrhoids CPT copyright 2019 American [employer] Association. All rights reserved. The codes documented in this report are preliminary and upon coder review may  be revised to meet current compliance requirements. Efrain Sella MD, MD 10/20/2020 11:02:03 AM This report has been signed electronically. Number of Addenda: 0 Note Initiated On: 10/20/2020 10:26 AM Scope Withdrawal Time: 0 hours 3 minutes 56 seconds  Total Procedure Duration: 0 hours 14 minutes 34 seconds  Estimated Blood Loss:  Estimated blood loss: none.      [facility]

## 2020-10-21 ENCOUNTER — Encounter: Payer: Self-pay | Admitting: Internal Medicine

## 2020-11-12 ENCOUNTER — Other Ambulatory Visit: Payer: Self-pay | Admitting: Internal Medicine

## 2021-03-09 DIAGNOSIS — E538 Deficiency of other specified B group vitamins: Secondary | ICD-10-CM | POA: Diagnosis not present

## 2021-03-09 DIAGNOSIS — Z125 Encounter for screening for malignant neoplasm of prostate: Secondary | ICD-10-CM | POA: Diagnosis not present

## 2021-03-09 DIAGNOSIS — E782 Mixed hyperlipidemia: Secondary | ICD-10-CM | POA: Diagnosis not present

## 2021-03-16 DIAGNOSIS — F0280 Dementia in other diseases classified elsewhere without behavioral disturbance: Secondary | ICD-10-CM | POA: Diagnosis not present

## 2021-03-16 DIAGNOSIS — Z23 Encounter for immunization: Secondary | ICD-10-CM | POA: Diagnosis not present

## 2021-03-16 DIAGNOSIS — Z0000 Encounter for general adult medical examination without abnormal findings: Secondary | ICD-10-CM | POA: Diagnosis not present

## 2021-03-16 DIAGNOSIS — G301 Alzheimer's disease with late onset: Secondary | ICD-10-CM | POA: Diagnosis not present

## 2021-03-16 DIAGNOSIS — E782 Mixed hyperlipidemia: Secondary | ICD-10-CM | POA: Diagnosis not present

## 2021-03-16 DIAGNOSIS — M5416 Radiculopathy, lumbar region: Secondary | ICD-10-CM | POA: Diagnosis not present

## 2021-03-16 DIAGNOSIS — E538 Deficiency of other specified B group vitamins: Secondary | ICD-10-CM | POA: Diagnosis not present

## 2021-03-16 DIAGNOSIS — R972 Elevated prostate specific antigen [PSA]: Secondary | ICD-10-CM | POA: Diagnosis not present

## 2021-04-27 DIAGNOSIS — M62838 Other muscle spasm: Secondary | ICD-10-CM | POA: Diagnosis not present

## 2021-04-27 DIAGNOSIS — M9903 Segmental and somatic dysfunction of lumbar region: Secondary | ICD-10-CM | POA: Diagnosis not present

## 2021-04-27 DIAGNOSIS — M5451 Vertebrogenic low back pain: Secondary | ICD-10-CM | POA: Diagnosis not present

## 2021-04-27 DIAGNOSIS — M25551 Pain in right hip: Secondary | ICD-10-CM | POA: Diagnosis not present

## 2021-04-27 DIAGNOSIS — M9902 Segmental and somatic dysfunction of thoracic region: Secondary | ICD-10-CM | POA: Diagnosis not present

## 2021-04-27 DIAGNOSIS — M546 Pain in thoracic spine: Secondary | ICD-10-CM | POA: Diagnosis not present

## 2021-04-29 DIAGNOSIS — M62838 Other muscle spasm: Secondary | ICD-10-CM | POA: Diagnosis not present

## 2021-04-29 DIAGNOSIS — M546 Pain in thoracic spine: Secondary | ICD-10-CM | POA: Diagnosis not present

## 2021-04-29 DIAGNOSIS — M9902 Segmental and somatic dysfunction of thoracic region: Secondary | ICD-10-CM | POA: Diagnosis not present

## 2021-04-29 DIAGNOSIS — M9903 Segmental and somatic dysfunction of lumbar region: Secondary | ICD-10-CM | POA: Diagnosis not present

## 2021-04-29 DIAGNOSIS — M5451 Vertebrogenic low back pain: Secondary | ICD-10-CM | POA: Diagnosis not present

## 2021-04-29 DIAGNOSIS — M25551 Pain in right hip: Secondary | ICD-10-CM | POA: Diagnosis not present

## 2021-05-02 DIAGNOSIS — M9903 Segmental and somatic dysfunction of lumbar region: Secondary | ICD-10-CM | POA: Diagnosis not present

## 2021-05-02 DIAGNOSIS — M62838 Other muscle spasm: Secondary | ICD-10-CM | POA: Diagnosis not present

## 2021-05-02 DIAGNOSIS — M25551 Pain in right hip: Secondary | ICD-10-CM | POA: Diagnosis not present

## 2021-05-02 DIAGNOSIS — M546 Pain in thoracic spine: Secondary | ICD-10-CM | POA: Diagnosis not present

## 2021-05-02 DIAGNOSIS — M9902 Segmental and somatic dysfunction of thoracic region: Secondary | ICD-10-CM | POA: Diagnosis not present

## 2021-05-02 DIAGNOSIS — M5451 Vertebrogenic low back pain: Secondary | ICD-10-CM | POA: Diagnosis not present

## 2021-05-09 DIAGNOSIS — M25551 Pain in right hip: Secondary | ICD-10-CM | POA: Diagnosis not present

## 2021-05-09 DIAGNOSIS — M9903 Segmental and somatic dysfunction of lumbar region: Secondary | ICD-10-CM | POA: Diagnosis not present

## 2021-05-09 DIAGNOSIS — M546 Pain in thoracic spine: Secondary | ICD-10-CM | POA: Diagnosis not present

## 2021-05-09 DIAGNOSIS — M9902 Segmental and somatic dysfunction of thoracic region: Secondary | ICD-10-CM | POA: Diagnosis not present

## 2021-05-09 DIAGNOSIS — M62838 Other muscle spasm: Secondary | ICD-10-CM | POA: Diagnosis not present

## 2021-05-09 DIAGNOSIS — M5451 Vertebrogenic low back pain: Secondary | ICD-10-CM | POA: Diagnosis not present

## 2021-05-13 DIAGNOSIS — M9903 Segmental and somatic dysfunction of lumbar region: Secondary | ICD-10-CM | POA: Diagnosis not present

## 2021-05-13 DIAGNOSIS — M9902 Segmental and somatic dysfunction of thoracic region: Secondary | ICD-10-CM | POA: Diagnosis not present

## 2021-05-13 DIAGNOSIS — M25551 Pain in right hip: Secondary | ICD-10-CM | POA: Diagnosis not present

## 2021-05-13 DIAGNOSIS — M5451 Vertebrogenic low back pain: Secondary | ICD-10-CM | POA: Diagnosis not present

## 2021-05-13 DIAGNOSIS — M62838 Other muscle spasm: Secondary | ICD-10-CM | POA: Diagnosis not present

## 2021-05-13 DIAGNOSIS — M546 Pain in thoracic spine: Secondary | ICD-10-CM | POA: Diagnosis not present

## 2021-05-18 DIAGNOSIS — M5451 Vertebrogenic low back pain: Secondary | ICD-10-CM | POA: Diagnosis not present

## 2021-05-18 DIAGNOSIS — M546 Pain in thoracic spine: Secondary | ICD-10-CM | POA: Diagnosis not present

## 2021-05-18 DIAGNOSIS — M25551 Pain in right hip: Secondary | ICD-10-CM | POA: Diagnosis not present

## 2021-05-18 DIAGNOSIS — M9903 Segmental and somatic dysfunction of lumbar region: Secondary | ICD-10-CM | POA: Diagnosis not present

## 2021-05-18 DIAGNOSIS — M9902 Segmental and somatic dysfunction of thoracic region: Secondary | ICD-10-CM | POA: Diagnosis not present

## 2021-05-18 DIAGNOSIS — M62838 Other muscle spasm: Secondary | ICD-10-CM | POA: Diagnosis not present

## 2021-05-25 DIAGNOSIS — M62838 Other muscle spasm: Secondary | ICD-10-CM | POA: Diagnosis not present

## 2021-05-25 DIAGNOSIS — M9902 Segmental and somatic dysfunction of thoracic region: Secondary | ICD-10-CM | POA: Diagnosis not present

## 2021-05-25 DIAGNOSIS — M9903 Segmental and somatic dysfunction of lumbar region: Secondary | ICD-10-CM | POA: Diagnosis not present

## 2021-05-25 DIAGNOSIS — M25551 Pain in right hip: Secondary | ICD-10-CM | POA: Diagnosis not present

## 2021-05-25 DIAGNOSIS — M546 Pain in thoracic spine: Secondary | ICD-10-CM | POA: Diagnosis not present

## 2021-05-25 DIAGNOSIS — M5451 Vertebrogenic low back pain: Secondary | ICD-10-CM | POA: Diagnosis not present

## 2021-06-01 DIAGNOSIS — M546 Pain in thoracic spine: Secondary | ICD-10-CM | POA: Diagnosis not present

## 2021-06-01 DIAGNOSIS — M9902 Segmental and somatic dysfunction of thoracic region: Secondary | ICD-10-CM | POA: Diagnosis not present

## 2021-06-01 DIAGNOSIS — M25551 Pain in right hip: Secondary | ICD-10-CM | POA: Diagnosis not present

## 2021-06-01 DIAGNOSIS — M5451 Vertebrogenic low back pain: Secondary | ICD-10-CM | POA: Diagnosis not present

## 2021-06-01 DIAGNOSIS — M9903 Segmental and somatic dysfunction of lumbar region: Secondary | ICD-10-CM | POA: Diagnosis not present

## 2021-06-01 DIAGNOSIS — M62838 Other muscle spasm: Secondary | ICD-10-CM | POA: Diagnosis not present

## 2021-06-08 DIAGNOSIS — M9903 Segmental and somatic dysfunction of lumbar region: Secondary | ICD-10-CM | POA: Diagnosis not present

## 2021-06-08 DIAGNOSIS — M5451 Vertebrogenic low back pain: Secondary | ICD-10-CM | POA: Diagnosis not present

## 2021-06-08 DIAGNOSIS — M546 Pain in thoracic spine: Secondary | ICD-10-CM | POA: Diagnosis not present

## 2021-06-08 DIAGNOSIS — M9902 Segmental and somatic dysfunction of thoracic region: Secondary | ICD-10-CM | POA: Diagnosis not present

## 2021-06-08 DIAGNOSIS — M25551 Pain in right hip: Secondary | ICD-10-CM | POA: Diagnosis not present

## 2021-06-08 DIAGNOSIS — M62838 Other muscle spasm: Secondary | ICD-10-CM | POA: Diagnosis not present

## 2021-06-15 DIAGNOSIS — M5416 Radiculopathy, lumbar region: Secondary | ICD-10-CM | POA: Diagnosis not present

## 2021-06-15 DIAGNOSIS — F0280 Dementia in other diseases classified elsewhere without behavioral disturbance: Secondary | ICD-10-CM | POA: Diagnosis not present

## 2021-06-15 DIAGNOSIS — Z0000 Encounter for general adult medical examination without abnormal findings: Secondary | ICD-10-CM | POA: Diagnosis not present

## 2021-06-15 DIAGNOSIS — G301 Alzheimer's disease with late onset: Secondary | ICD-10-CM | POA: Diagnosis not present

## 2021-07-12 DIAGNOSIS — M47817 Spondylosis without myelopathy or radiculopathy, lumbosacral region: Secondary | ICD-10-CM | POA: Diagnosis not present

## 2021-08-15 DIAGNOSIS — M47817 Spondylosis without myelopathy or radiculopathy, lumbosacral region: Secondary | ICD-10-CM | POA: Diagnosis not present

## 2021-09-13 DIAGNOSIS — R972 Elevated prostate specific antigen [PSA]: Secondary | ICD-10-CM | POA: Diagnosis not present

## 2021-09-13 DIAGNOSIS — E782 Mixed hyperlipidemia: Secondary | ICD-10-CM | POA: Diagnosis not present

## 2021-09-13 DIAGNOSIS — E538 Deficiency of other specified B group vitamins: Secondary | ICD-10-CM | POA: Diagnosis not present

## 2021-09-15 DIAGNOSIS — M47817 Spondylosis without myelopathy or radiculopathy, lumbosacral region: Secondary | ICD-10-CM | POA: Diagnosis not present

## 2021-09-21 DIAGNOSIS — G301 Alzheimer's disease with late onset: Secondary | ICD-10-CM | POA: Diagnosis not present

## 2021-09-21 DIAGNOSIS — Z125 Encounter for screening for malignant neoplasm of prostate: Secondary | ICD-10-CM | POA: Diagnosis not present

## 2021-09-21 DIAGNOSIS — M5416 Radiculopathy, lumbar region: Secondary | ICD-10-CM | POA: Diagnosis not present

## 2021-09-21 DIAGNOSIS — M542 Cervicalgia: Secondary | ICD-10-CM | POA: Diagnosis not present

## 2021-09-21 DIAGNOSIS — E538 Deficiency of other specified B group vitamins: Secondary | ICD-10-CM | POA: Diagnosis not present

## 2021-09-21 DIAGNOSIS — F0280 Dementia in other diseases classified elsewhere without behavioral disturbance: Secondary | ICD-10-CM | POA: Diagnosis not present

## 2021-09-21 DIAGNOSIS — E782 Mixed hyperlipidemia: Secondary | ICD-10-CM | POA: Diagnosis not present

## 2021-10-10 DIAGNOSIS — M47817 Spondylosis without myelopathy or radiculopathy, lumbosacral region: Secondary | ICD-10-CM | POA: Diagnosis not present

## 2021-11-29 DIAGNOSIS — M47817 Spondylosis without myelopathy or radiculopathy, lumbosacral region: Secondary | ICD-10-CM | POA: Diagnosis not present

## 2022-01-03 DIAGNOSIS — G8929 Other chronic pain: Secondary | ICD-10-CM | POA: Diagnosis not present

## 2022-01-03 DIAGNOSIS — M4316 Spondylolisthesis, lumbar region: Secondary | ICD-10-CM | POA: Diagnosis not present

## 2022-01-03 DIAGNOSIS — M5441 Lumbago with sciatica, right side: Secondary | ICD-10-CM | POA: Diagnosis not present

## 2022-01-03 DIAGNOSIS — M47816 Spondylosis without myelopathy or radiculopathy, lumbar region: Secondary | ICD-10-CM | POA: Diagnosis not present

## 2022-01-04 ENCOUNTER — Other Ambulatory Visit: Payer: Self-pay | Admitting: Neurosurgery

## 2022-01-04 DIAGNOSIS — G8929 Other chronic pain: Secondary | ICD-10-CM

## 2022-01-10 ENCOUNTER — Other Ambulatory Visit: Payer: PPO

## 2022-01-10 ENCOUNTER — Inpatient Hospital Stay: Admission: RE | Admit: 2022-01-10 | Payer: PPO | Source: Ambulatory Visit

## 2022-01-16 ENCOUNTER — Ambulatory Visit
Admission: RE | Admit: 2022-01-16 | Discharge: 2022-01-16 | Disposition: A | Discharge status: Home / Self Care | Payer: PPO | Source: Ambulatory Visit | Attending: Neurosurgery | Admitting: Neurosurgery

## 2022-01-16 DIAGNOSIS — G8929 Other chronic pain: Secondary | ICD-10-CM

## 2022-01-16 DIAGNOSIS — M2578 Osteophyte, vertebrae: Secondary | ICD-10-CM | POA: Diagnosis not present

## 2022-01-16 DIAGNOSIS — M47816 Spondylosis without myelopathy or radiculopathy, lumbar region: Secondary | ICD-10-CM | POA: Diagnosis not present

## 2022-01-16 DIAGNOSIS — M4186 Other forms of scoliosis, lumbar region: Secondary | ICD-10-CM | POA: Diagnosis not present

## 2022-01-16 DIAGNOSIS — M4316 Spondylolisthesis, lumbar region: Secondary | ICD-10-CM | POA: Diagnosis not present

## 2022-01-16 MED ORDER — DIAZEPAM 5 MG PO TABS
5.0000 mg | ORAL_TABLET | Freq: Once | ORAL | Status: AC
  Administered 2022-01-16: 5 mg via ORAL

## 2022-01-16 MED ORDER — ONDANSETRON HCL 4 MG/2ML IJ SOLN: 4.0000 mg | Freq: Once | INTRAMUSCULAR | Status: DC | PRN

## 2022-01-16 MED ORDER — MEPERIDINE HCL 50 MG/ML IJ SOLN: 50.0000 mg | Freq: Once | INTRAMUSCULAR | Status: DC | PRN

## 2022-01-16 MED ORDER — IOPAMIDOL (ISOVUE-M 200) INJECTION 41%
18.0000 mL | Freq: Once | INTRAMUSCULAR | Status: AC
  Administered 2022-01-16: 18 mL via INTRATHECAL

## 2022-01-16 NOTE — Discharge Instructions (Signed)

## 2022-02-07 DIAGNOSIS — M47817 Spondylosis without myelopathy or radiculopathy, lumbosacral region: Secondary | ICD-10-CM | POA: Diagnosis not present

## 2022-02-07 DIAGNOSIS — M4316 Spondylolisthesis, lumbar region: Secondary | ICD-10-CM | POA: Diagnosis not present

## 2022-02-13 DIAGNOSIS — M4316 Spondylolisthesis, lumbar region: Secondary | ICD-10-CM | POA: Diagnosis not present

## 2022-02-20 DIAGNOSIS — M4316 Spondylolisthesis, lumbar region: Secondary | ICD-10-CM | POA: Diagnosis not present

## 2022-02-28 DIAGNOSIS — M4316 Spondylolisthesis, lumbar region: Secondary | ICD-10-CM | POA: Diagnosis not present

## 2022-03-08 DIAGNOSIS — M4316 Spondylolisthesis, lumbar region: Secondary | ICD-10-CM | POA: Diagnosis not present

## 2022-03-10 DIAGNOSIS — M4316 Spondylolisthesis, lumbar region: Secondary | ICD-10-CM | POA: Diagnosis not present

## 2022-03-15 DIAGNOSIS — M4316 Spondylolisthesis, lumbar region: Secondary | ICD-10-CM | POA: Diagnosis not present

## 2022-03-21 DIAGNOSIS — M4316 Spondylolisthesis, lumbar region: Secondary | ICD-10-CM | POA: Diagnosis not present

## 2022-03-23 DIAGNOSIS — M4316 Spondylolisthesis, lumbar region: Secondary | ICD-10-CM | POA: Diagnosis not present

## 2022-03-23 DIAGNOSIS — E538 Deficiency of other specified B group vitamins: Secondary | ICD-10-CM | POA: Diagnosis not present

## 2022-03-23 DIAGNOSIS — E782 Mixed hyperlipidemia: Secondary | ICD-10-CM | POA: Diagnosis not present

## 2022-03-23 DIAGNOSIS — Z125 Encounter for screening for malignant neoplasm of prostate: Secondary | ICD-10-CM | POA: Diagnosis not present

## 2022-03-28 DIAGNOSIS — M4316 Spondylolisthesis, lumbar region: Secondary | ICD-10-CM | POA: Diagnosis not present

## 2022-03-30 DIAGNOSIS — G301 Alzheimer's disease with late onset: Secondary | ICD-10-CM | POA: Diagnosis not present

## 2022-03-30 DIAGNOSIS — Z0000 Encounter for general adult medical examination without abnormal findings: Secondary | ICD-10-CM | POA: Diagnosis not present

## 2022-03-30 DIAGNOSIS — E782 Mixed hyperlipidemia: Secondary | ICD-10-CM | POA: Diagnosis not present

## 2022-03-30 DIAGNOSIS — F0280 Dementia in other diseases classified elsewhere without behavioral disturbance: Secondary | ICD-10-CM | POA: Diagnosis not present

## 2022-03-30 DIAGNOSIS — M4316 Spondylolisthesis, lumbar region: Secondary | ICD-10-CM | POA: Diagnosis not present

## 2022-03-30 DIAGNOSIS — M4126 Other idiopathic scoliosis, lumbar region: Secondary | ICD-10-CM | POA: Diagnosis not present

## 2022-03-30 DIAGNOSIS — Z23 Encounter for immunization: Secondary | ICD-10-CM | POA: Diagnosis not present

## 2022-04-04 DIAGNOSIS — M4316 Spondylolisthesis, lumbar region: Secondary | ICD-10-CM | POA: Diagnosis not present

## 2022-04-06 DIAGNOSIS — M4316 Spondylolisthesis, lumbar region: Secondary | ICD-10-CM | POA: Diagnosis not present

## 2022-04-11 DIAGNOSIS — M4316 Spondylolisthesis, lumbar region: Secondary | ICD-10-CM | POA: Diagnosis not present

## 2022-04-14 DIAGNOSIS — M4316 Spondylolisthesis, lumbar region: Secondary | ICD-10-CM | POA: Diagnosis not present

## 2022-05-09 DIAGNOSIS — M47816 Spondylosis without myelopathy or radiculopathy, lumbar region: Secondary | ICD-10-CM | POA: Diagnosis not present

## 2022-06-06 DIAGNOSIS — F0280 Dementia in other diseases classified elsewhere without behavioral disturbance: Secondary | ICD-10-CM | POA: Diagnosis not present

## 2022-06-06 DIAGNOSIS — R1084 Generalized abdominal pain: Secondary | ICD-10-CM | POA: Diagnosis not present

## 2022-06-06 DIAGNOSIS — G301 Alzheimer's disease with late onset: Secondary | ICD-10-CM | POA: Diagnosis not present

## 2022-09-21 DIAGNOSIS — E782 Mixed hyperlipidemia: Secondary | ICD-10-CM | POA: Diagnosis not present

## 2022-09-28 DIAGNOSIS — Z0000 Encounter for general adult medical examination without abnormal findings: Secondary | ICD-10-CM | POA: Diagnosis not present

## 2022-09-28 DIAGNOSIS — E538 Deficiency of other specified B group vitamins: Secondary | ICD-10-CM | POA: Diagnosis not present

## 2022-09-28 DIAGNOSIS — E782 Mixed hyperlipidemia: Secondary | ICD-10-CM | POA: Diagnosis not present

## 2022-09-28 DIAGNOSIS — F1190 Opioid use, unspecified, uncomplicated: Secondary | ICD-10-CM | POA: Diagnosis not present

## 2022-09-28 DIAGNOSIS — G301 Alzheimer's disease with late onset: Secondary | ICD-10-CM | POA: Diagnosis not present

## 2022-09-28 DIAGNOSIS — F0280 Dementia in other diseases classified elsewhere without behavioral disturbance: Secondary | ICD-10-CM | POA: Diagnosis not present

## 2022-09-28 DIAGNOSIS — M4126 Other idiopathic scoliosis, lumbar region: Secondary | ICD-10-CM | POA: Diagnosis not present

## 2022-09-28 DIAGNOSIS — Z125 Encounter for screening for malignant neoplasm of prostate: Secondary | ICD-10-CM | POA: Diagnosis not present

## 2022-12-15 DIAGNOSIS — H5203 Hypermetropia, bilateral: Secondary | ICD-10-CM | POA: Diagnosis not present

## 2022-12-15 DIAGNOSIS — H26493 Other secondary cataract, bilateral: Secondary | ICD-10-CM | POA: Diagnosis not present

## 2022-12-25 DIAGNOSIS — J40 Bronchitis, not specified as acute or chronic: Secondary | ICD-10-CM | POA: Diagnosis not present

## 2022-12-25 DIAGNOSIS — J069 Acute upper respiratory infection, unspecified: Secondary | ICD-10-CM | POA: Diagnosis not present

## 2023-01-15 DIAGNOSIS — Z961 Presence of intraocular lens: Secondary | ICD-10-CM | POA: Diagnosis not present

## 2023-01-15 DIAGNOSIS — H26493 Other secondary cataract, bilateral: Secondary | ICD-10-CM | POA: Diagnosis not present

## 2023-02-01 DIAGNOSIS — H26493 Other secondary cataract, bilateral: Secondary | ICD-10-CM | POA: Diagnosis not present

## 2023-03-05 DIAGNOSIS — H26493 Other secondary cataract, bilateral: Secondary | ICD-10-CM | POA: Diagnosis not present

## 2023-04-06 DIAGNOSIS — E538 Deficiency of other specified B group vitamins: Secondary | ICD-10-CM | POA: Diagnosis not present

## 2023-04-06 DIAGNOSIS — Z125 Encounter for screening for malignant neoplasm of prostate: Secondary | ICD-10-CM | POA: Diagnosis not present

## 2023-04-06 DIAGNOSIS — F1190 Opioid use, unspecified, uncomplicated: Secondary | ICD-10-CM | POA: Diagnosis not present

## 2023-04-06 DIAGNOSIS — E782 Mixed hyperlipidemia: Secondary | ICD-10-CM | POA: Diagnosis not present

## 2023-04-13 DIAGNOSIS — G301 Alzheimer's disease with late onset: Secondary | ICD-10-CM | POA: Diagnosis not present

## 2023-04-13 DIAGNOSIS — Z79899 Other long term (current) drug therapy: Secondary | ICD-10-CM | POA: Diagnosis not present

## 2023-04-13 DIAGNOSIS — E538 Deficiency of other specified B group vitamins: Secondary | ICD-10-CM | POA: Diagnosis not present

## 2023-04-13 DIAGNOSIS — J849 Interstitial pulmonary disease, unspecified: Secondary | ICD-10-CM | POA: Diagnosis not present

## 2023-04-13 DIAGNOSIS — Z23 Encounter for immunization: Secondary | ICD-10-CM | POA: Diagnosis not present

## 2023-04-13 DIAGNOSIS — F330 Major depressive disorder, recurrent, mild: Secondary | ICD-10-CM | POA: Diagnosis not present

## 2023-04-13 DIAGNOSIS — F0280 Dementia in other diseases classified elsewhere without behavioral disturbance: Secondary | ICD-10-CM | POA: Diagnosis not present

## 2023-04-13 DIAGNOSIS — R972 Elevated prostate specific antigen [PSA]: Secondary | ICD-10-CM | POA: Diagnosis not present

## 2023-04-13 DIAGNOSIS — Z0000 Encounter for general adult medical examination without abnormal findings: Secondary | ICD-10-CM | POA: Diagnosis not present

## 2023-04-30 DIAGNOSIS — J40 Bronchitis, not specified as acute or chronic: Secondary | ICD-10-CM | POA: Diagnosis not present

## 2023-05-10 ENCOUNTER — Other Ambulatory Visit: Payer: Self-pay | Admitting: Internal Medicine

## 2023-05-10 ENCOUNTER — Ambulatory Visit
Admission: RE | Admit: 2023-05-10 | Discharge: 2023-05-10 | Disposition: A | Discharge status: Home / Self Care | Payer: PPO | Source: Ambulatory Visit | Attending: Internal Medicine | Admitting: Internal Medicine

## 2023-05-10 DIAGNOSIS — J439 Emphysema, unspecified: Secondary | ICD-10-CM | POA: Diagnosis not present

## 2023-05-10 DIAGNOSIS — R053 Chronic cough: Secondary | ICD-10-CM | POA: Diagnosis not present

## 2023-05-10 DIAGNOSIS — R0600 Dyspnea, unspecified: Secondary | ICD-10-CM

## 2023-05-10 DIAGNOSIS — I2510 Atherosclerotic heart disease of native coronary artery without angina pectoris: Secondary | ICD-10-CM | POA: Diagnosis not present

## 2023-05-10 DIAGNOSIS — R000 Tachycardia, unspecified: Secondary | ICD-10-CM

## 2023-05-10 MED ORDER — IOHEXOL 350 MG/ML SOLN
75.0000 mL | Freq: Once | INTRAVENOUS | Status: AC | PRN
  Administered 2023-05-10: 75 mL via INTRAVENOUS

## 2023-05-11 ENCOUNTER — Other Ambulatory Visit: Payer: Self-pay | Admitting: Internal Medicine

## 2023-05-11 DIAGNOSIS — R0600 Dyspnea, unspecified: Secondary | ICD-10-CM

## 2023-05-11 DIAGNOSIS — R000 Tachycardia, unspecified: Secondary | ICD-10-CM

## 2023-05-21 DIAGNOSIS — R053 Chronic cough: Secondary | ICD-10-CM | POA: Diagnosis not present

## 2023-05-21 DIAGNOSIS — J84114 Acute interstitial pneumonitis: Secondary | ICD-10-CM | POA: Diagnosis not present

## 2023-06-18 DIAGNOSIS — H10503 Unspecified blepharoconjunctivitis, bilateral: Secondary | ICD-10-CM | POA: Diagnosis not present

## 2023-06-18 DIAGNOSIS — H1045 Other chronic allergic conjunctivitis: Secondary | ICD-10-CM | POA: Diagnosis not present

## 2023-07-13 DIAGNOSIS — R972 Elevated prostate specific antigen [PSA]: Secondary | ICD-10-CM | POA: Diagnosis not present

## 2023-07-31 DIAGNOSIS — J40 Bronchitis, not specified as acute or chronic: Secondary | ICD-10-CM | POA: Diagnosis not present

## 2023-07-31 DIAGNOSIS — J439 Emphysema, unspecified: Secondary | ICD-10-CM | POA: Diagnosis not present

## 2023-10-04 DIAGNOSIS — Z79899 Other long term (current) drug therapy: Secondary | ICD-10-CM | POA: Diagnosis not present

## 2023-10-10 DIAGNOSIS — Z79899 Other long term (current) drug therapy: Secondary | ICD-10-CM | POA: Diagnosis not present

## 2023-10-11 DIAGNOSIS — R739 Hyperglycemia, unspecified: Secondary | ICD-10-CM | POA: Diagnosis not present

## 2023-10-11 DIAGNOSIS — Z0000 Encounter for general adult medical examination without abnormal findings: Secondary | ICD-10-CM | POA: Diagnosis not present

## 2023-10-11 DIAGNOSIS — F339 Major depressive disorder, recurrent, unspecified: Secondary | ICD-10-CM | POA: Diagnosis not present

## 2023-10-11 DIAGNOSIS — E782 Mixed hyperlipidemia: Secondary | ICD-10-CM | POA: Diagnosis not present

## 2023-10-11 DIAGNOSIS — M5416 Radiculopathy, lumbar region: Secondary | ICD-10-CM | POA: Diagnosis not present

## 2023-10-11 DIAGNOSIS — Z125 Encounter for screening for malignant neoplasm of prostate: Secondary | ICD-10-CM | POA: Diagnosis not present

## 2023-10-11 DIAGNOSIS — F0280 Dementia in other diseases classified elsewhere without behavioral disturbance: Secondary | ICD-10-CM | POA: Diagnosis not present

## 2023-10-11 DIAGNOSIS — E538 Deficiency of other specified B group vitamins: Secondary | ICD-10-CM | POA: Diagnosis not present

## 2023-10-11 DIAGNOSIS — G301 Alzheimer's disease with late onset: Secondary | ICD-10-CM | POA: Diagnosis not present

## 2023-10-29 DIAGNOSIS — R5383 Other fatigue: Secondary | ICD-10-CM | POA: Diagnosis not present

## 2023-12-06 DIAGNOSIS — D2262 Melanocytic nevi of left upper limb, including shoulder: Secondary | ICD-10-CM | POA: Diagnosis not present

## 2023-12-06 DIAGNOSIS — D225 Melanocytic nevi of trunk: Secondary | ICD-10-CM | POA: Diagnosis not present

## 2023-12-06 DIAGNOSIS — D2272 Melanocytic nevi of left lower limb, including hip: Secondary | ICD-10-CM | POA: Diagnosis not present

## 2023-12-06 DIAGNOSIS — L821 Other seborrheic keratosis: Secondary | ICD-10-CM | POA: Diagnosis not present

## 2023-12-06 DIAGNOSIS — Z85828 Personal history of other malignant neoplasm of skin: Secondary | ICD-10-CM | POA: Diagnosis not present

## 2023-12-06 DIAGNOSIS — D2261 Melanocytic nevi of right upper limb, including shoulder: Secondary | ICD-10-CM | POA: Diagnosis not present

## 2023-12-06 DIAGNOSIS — Z08 Encounter for follow-up examination after completed treatment for malignant neoplasm: Secondary | ICD-10-CM | POA: Diagnosis not present

## 2023-12-06 DIAGNOSIS — L570 Actinic keratosis: Secondary | ICD-10-CM | POA: Diagnosis not present

## 2023-12-06 DIAGNOSIS — D485 Neoplasm of uncertain behavior of skin: Secondary | ICD-10-CM | POA: Diagnosis not present

## 2023-12-06 DIAGNOSIS — D2271 Melanocytic nevi of right lower limb, including hip: Secondary | ICD-10-CM | POA: Diagnosis not present

## 2024-02-18 DIAGNOSIS — D0461 Carcinoma in situ of skin of right upper limb, including shoulder: Secondary | ICD-10-CM | POA: Diagnosis not present

## 2024-03-31 DIAGNOSIS — M4126 Other idiopathic scoliosis, lumbar region: Secondary | ICD-10-CM | POA: Diagnosis not present

## 2024-03-31 DIAGNOSIS — M25552 Pain in left hip: Secondary | ICD-10-CM | POA: Diagnosis not present

## 2024-03-31 DIAGNOSIS — M5116 Intervertebral disc disorders with radiculopathy, lumbar region: Secondary | ICD-10-CM | POA: Diagnosis not present

## 2024-03-31 DIAGNOSIS — M7918 Myalgia, other site: Secondary | ICD-10-CM | POA: Diagnosis not present

## 2024-03-31 DIAGNOSIS — G8929 Other chronic pain: Secondary | ICD-10-CM | POA: Diagnosis not present

## 2024-04-15 DIAGNOSIS — R739 Hyperglycemia, unspecified: Secondary | ICD-10-CM | POA: Diagnosis not present

## 2024-04-15 DIAGNOSIS — E782 Mixed hyperlipidemia: Secondary | ICD-10-CM | POA: Diagnosis not present

## 2024-04-15 DIAGNOSIS — Z125 Encounter for screening for malignant neoplasm of prostate: Secondary | ICD-10-CM | POA: Diagnosis not present

## 2024-04-15 DIAGNOSIS — E538 Deficiency of other specified B group vitamins: Secondary | ICD-10-CM | POA: Diagnosis not present

## 2024-04-23 DIAGNOSIS — Z79899 Other long term (current) drug therapy: Secondary | ICD-10-CM | POA: Diagnosis not present

## 2024-04-23 DIAGNOSIS — Z0000 Encounter for general adult medical examination without abnormal findings: Secondary | ICD-10-CM | POA: Diagnosis not present

## 2024-04-23 DIAGNOSIS — Z23 Encounter for immunization: Secondary | ICD-10-CM | POA: Diagnosis not present

## 2024-04-23 DIAGNOSIS — R739 Hyperglycemia, unspecified: Secondary | ICD-10-CM | POA: Diagnosis not present

## 2024-04-23 DIAGNOSIS — M5416 Radiculopathy, lumbar region: Secondary | ICD-10-CM | POA: Diagnosis not present

## 2024-04-23 DIAGNOSIS — F1190 Opioid use, unspecified, uncomplicated: Secondary | ICD-10-CM | POA: Diagnosis not present

## 2024-04-23 DIAGNOSIS — E538 Deficiency of other specified B group vitamins: Secondary | ICD-10-CM | POA: Diagnosis not present

## 2024-04-23 DIAGNOSIS — E782 Mixed hyperlipidemia: Secondary | ICD-10-CM | POA: Diagnosis not present

## 2024-05-04 DIAGNOSIS — Z7729 Contact with and (suspected ) exposure to other hazardous substances: Secondary | ICD-10-CM | POA: Insufficient documentation

## 2024-06-24 ENCOUNTER — Ambulatory Visit: Admitting: Urology

## 2024-06-24 ENCOUNTER — Encounter: Payer: Self-pay | Admitting: Urology

## 2024-06-24 VITALS — BP 126/72 | HR 98

## 2024-06-24 DIAGNOSIS — N401 Enlarged prostate with lower urinary tract symptoms: Secondary | ICD-10-CM

## 2024-06-24 DIAGNOSIS — R399 Unspecified symptoms and signs involving the genitourinary system: Secondary | ICD-10-CM

## 2024-06-24 LAB — URINALYSIS, COMPLETE
Bilirubin, UA: NEGATIVE
Glucose, UA: NEGATIVE
Ketones, UA: NEGATIVE
Leukocytes,UA: NEGATIVE
Nitrite, UA: NEGATIVE
Protein,UA: NEGATIVE
RBC, UA: NEGATIVE
Specific Gravity, UA: 1.025 (ref 1.005–1.030)
Urobilinogen, Ur: 0.2 mg/dL (ref 0.2–1.0)
pH, UA: 5.5 (ref 5.0–7.5)

## 2024-06-24 LAB — MICROSCOPIC EXAMINATION
Bacteria, UA: NONE SEEN
RBC, Urine: NONE SEEN /HPF (ref 0–2)

## 2024-06-24 LAB — BLADDER SCAN AMB NON-IMAGING

## 2024-06-24 MED ORDER — GEMTESA 75 MG PO TABS: 75.0000 mg | ORAL_TABLET | Freq: Every day | ORAL | Status: AC

## 2024-06-24 NOTE — Progress Notes (Signed)
 "  06/24/2024 5:22 PM   Philip Brady Going April 24, 1941 [id number]  Referring provider: Cleotilde Oneil FALCON, MD [phone] [facility] [address] [facility] [place],  [place] [address]  Chief Complaint  Patient presents with   Benign Prostatic Hypertrophy    HPI: Philip Brady is a 84 y.o. male referred for evaluation of lower urinary tract symptoms.  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 and scheduled for neurosurgery follow-up later this month No bowel symptoms   PMH: Past Medical History:  Diagnosis Date   Colon polyps    Diverticulosis    GERD (gastroesophageal reflux disease)    Hyperlipidemia    Recurrent urticaria     Surgical History: Past Surgical History:  Procedure Laterality Date   APPENDECTOMY     COLONOSCOPY WITH PROPOFOL  N/A 10/20/2020   Procedure: COLONOSCOPY WITH PROPOFOL ;  Surgeon: Toledo, Ladell POUR, MD;  Location: [facility] ENDOSCOPY;  Service: Gastroenterology;  Laterality: N/A;   ESOPHAGOGASTRODUODENOSCOPY (EGD) WITH PROPOFOL  N/A 05/07/2018   Procedure: ESOPHAGOGASTRODUODENOSCOPY (EGD) WITH PROPOFOL ;  Surgeon: Gaylyn Gladis PENNER, MD;  Location: [facility] ENDOSCOPY;  Service: Endoscopy;  Laterality: N/A;   ESOPHAGOGASTRODUODENOSCOPY (EGD) WITH PROPOFOL  N/A 10/20/2020   Procedure: ESOPHAGOGASTRODUODENOSCOPY (EGD) WITH PROPOFOL ;  Surgeon: Toledo, Ladell POUR, MD;  Location: [facility] ENDOSCOPY;  Service: Gastroenterology;  Laterality: N/A;   HERNIA REPAIR     Left knee surgery      Home Medications:  Allergies as of 06/24/2024       Reactions   Baclofen    Penicillins         Medication List        Accurate as of June 24, 2024  5:22 PM. If you have any questions, ask your nurse or doctor.           STOP taking these medications    galantamine 8 MG tablet Commonly known as: RAZADYNE Stopped by: Glendia Barba, MD   traMADol 50 MG tablet Commonly known as: ULTRAM Stopped by: Glendia Barba, MD   venlafaxine 37.5 MG tablet Commonly known as: EFFEXOR Stopped by: Glendia Barba, MD       TAKE these medications    acetaminophen 325 MG tablet Commonly known as: TYLENOL Take 650 mg by mouth 3 (three) times daily as needed.   cholecalciferol 10 MCG (400 UNIT) Tabs tablet Commonly known as: VITAMIN D3 Take 1,000 Units by mouth.   cyanocobalamin  1000 MCG tablet Commonly known as: VITAMIN B12 Take 1,000 mcg by mouth daily.   donepezil 10 MG tablet Commonly known as: ARICEPT Take 10 mg by mouth at bedtime.   famotidine 40 MG tablet Commonly known as: PEPCID Take 40 mg by mouth daily.   Gemtesa  75 MG Tabs Generic drug: Vibegron  Take 1 tablet (75 mg total) by mouth daily. Started by: Glendia Barba, MD   hydrochlorothiazide 12.5 MG capsule Commonly known as: MICROZIDE Take 12.5 mg by mouth daily.   magnesium oxide 400 MG tablet Commonly known as: MAG-OX Take 400 mg by mouth daily.   meloxicam 7.5 MG tablet Commonly known as: MOBIC Take 7.5 mg by mouth daily.   memantine 5 MG tablet Commonly known as: NAMENDA Take 5 mg by mouth 2 (two) times daily.   pantoprazole 40 MG tablet Commonly known as: PROTONIX Take 40  mg by mouth daily. What changed: Another medication with the same name was removed. Continue taking this medication, and follow the directions you see here. Changed by: Glendia Barba, MD   rivastigmine 3 MG capsule Commonly known as: EXELON Take 3 mg by mouth.   tamsulosin 0.4 MG Caps capsule Commonly known as: FLOMAX Take 0.4 mg by mouth. What changed: Another medication with the same name was removed. Continue taking this medication, and follow the directions you see here. Changed by: Glendia Barba, MD   tiZANidine 4 MG tablet Commonly known  as: ZANAFLEX Take 4 mg by mouth every 6 (six) hours as needed for muscle spasms.   traZODone 50 MG tablet Commonly known as: DESYREL Take 50 mg by mouth at bedtime.        Allergies: Allergies[1]  Family History: Family History  Problem Relation Age of Onset   Breast cancer Sister 52    Social History:  reports that he has quit smoking. He has never used smokeless tobacco. He reports that he does not currently use alcohol. He reports that he does not use drugs.   Physical Exam: BP 126/72   Pulse 98   SpO2 96%   Constitutional:  Alert, No acute distress. HEENT: [place] AT Respiratory: Normal respiratory effort, no increased work of breathing. Psychiatric: Normal mood and affect.  Laboratory Data:  Urinalysis Dipstick/microscopy negative  Assessment & Plan:    1.  Lower urinary tract symptoms Moderate severity based on IPSS Most bothersome symptoms are storage related.  Weak urinary stream may be more related to decreased urine volume We discussed potential etiologies including BPH and neurogenic detrusor overactivity secondary to Alzheimer's PVR today 0 mL Add Gemtesa  75 mg daily-samples given Follow-up visit ~1 month symptom recheck   [name], MD  [facility] [address], [address] [place], [place] [address] [phone]    [1]  Allergies Allergen Reactions   Baclofen    Penicillins    "

## 2024-06-25 ENCOUNTER — Encounter: Payer: Self-pay | Admitting: Orthopedic Surgery

## 2024-06-25 DIAGNOSIS — F0390 Unspecified dementia without behavioral disturbance: Secondary | ICD-10-CM | POA: Insufficient documentation

## 2024-06-25 DIAGNOSIS — B3781 Candidal esophagitis: Secondary | ICD-10-CM | POA: Insufficient documentation

## 2024-06-25 NOTE — Progress Notes (Unsigned)
 "  Referring Physician:  Mardee Lynwood SQUIBB, MD [address] [facility] [address] [facility] [place],  [place] [address]  Primary Physician:  Cleotilde Oneil FALCON, MD  History of Present Illness: 06/25/2024*** Philip Brady has a history of OSA, GERD, alzheimer's dementia, scoliosis, BPH, B12 deficiency, chronic PTSD, hyperlipidemia.   Saw Dr. Hooten for left hip/leg pain on 05/02/24 and he recommended he follow up with Dr. Clois to discuss other options including possible SCS.***    History back pain Left buttocks pain, down to left leg. Numbness in the lower part of the left leg  PCP Ted) has him on oxycodone bid.   Duration: *** Location: *** Quality: *** Severity: ***  Precipitating: aggravated by *** Modifying factors: made better by *** Weakness: none Timing: ***  Tobacco use: Does not smoke.   Bowel/Bladder Dysfunction: none  Conservative measures:  Physical therapy: *** has not participated in recently Multimodal medical therapy including regular antiinflammatories: *** Tylenol, Hydrocodone, Meloxicam, Tizanidine Injections: ***  04/14/2019: right piriformis trigger point 05/27/2018: Left L5-S1 transforaminal ESI, Left S1 transforaminal ESI 03/25/2018: Left L5-S1 and right S1 transforaminal ESI (moderate relief of left-sided pain, good relief of right-sided pain, dexamethasone  10 mg)   Past Surgery: ***no spine surgery  Philip Brady has ***no symptoms of cervical myelopathy.  The symptoms are causing a significant impact on the patient's life.   Review of Systems:  A 10 point review of systems is negative, except for the pertinent positives and negatives detailed in the HPI.  Past Medical History: Past Medical History:  Diagnosis Date   Colon polyps    Diverticulosis    GERD (gastroesophageal reflux disease)    Hyperlipidemia    Recurrent urticaria     Past Surgical History: Past Surgical History:  Procedure Laterality Date   APPENDECTOMY      COLONOSCOPY WITH PROPOFOL  N/A 10/20/2020   Procedure: COLONOSCOPY WITH PROPOFOL ;  Surgeon: Toledo, Ladell POUR, MD;  Location: [facility] ENDOSCOPY;  Service: Gastroenterology;  Laterality: N/A;   ESOPHAGOGASTRODUODENOSCOPY (EGD) WITH PROPOFOL  N/A 05/07/2018   Procedure: ESOPHAGOGASTRODUODENOSCOPY (EGD) WITH PROPOFOL ;  Surgeon: Gaylyn Gladis PENNER, MD;  Location: [facility] ENDOSCOPY;  Service: Endoscopy;  Laterality: N/A;   ESOPHAGOGASTRODUODENOSCOPY (EGD) WITH PROPOFOL  N/A 10/20/2020   Procedure: ESOPHAGOGASTRODUODENOSCOPY (EGD) WITH PROPOFOL ;  Surgeon: Toledo, Ladell POUR, MD;  Location: [facility] ENDOSCOPY;  Service: Gastroenterology;  Laterality: N/A;   HERNIA REPAIR     Left knee surgery      Allergies: Allergies as of 06/30/2024 - Review Complete 06/24/2024  Allergen Reaction Noted   Baclofen  10/19/2020   Penicillins  05/06/2018    Medications: Outpatient Encounter Medications as of 06/30/2024  Medication Sig   HYDROcodone-acetaminophen (NORCO/VICODIN) 5-325 MG tablet Take 1 tablet by mouth every 6 (six) hours as needed.   acetaminophen (TYLENOL) 325 MG tablet Take 650 mg by mouth 3 (three) times daily as needed.   cholecalciferol (VITAMIN D3) 10 MCG (400 UNIT) TABS tablet Take 1,000 Units by mouth.   donepezil (ARICEPT) 10 MG tablet Take 10 mg by mouth at bedtime.   famotidine (PEPCID) 40 MG tablet Take 40 mg by mouth daily.   hydrochlorothiazide (MICROZIDE) 12.5 MG capsule Take 12.5 mg by mouth daily.   magnesium oxide (MAG-OX) 400 MG tablet Take 400 mg by mouth daily.   meloxicam (MOBIC) 7.5 MG tablet Take 7.5 mg by mouth daily.   memantine (NAMENDA) 5 MG tablet Take 5 mg by mouth 2 (two) times daily.   pantoprazole (PROTONIX) 40 MG tablet Take  40 mg by mouth daily.   rivastigmine (EXELON) 3 MG capsule Take 3 mg by mouth.   tadalafil (CIALIS) 20 MG tablet Take 20 mg by mouth daily as needed.   tamsulosin (FLOMAX) 0.4 MG CAPS capsule Take 0.4 mg by mouth.   tiZANidine (ZANAFLEX) 4 MG tablet Take 4 mg  by mouth every 6 (six) hours as needed for muscle spasms.   traZODone (DESYREL) 50 MG tablet Take 50 mg by mouth at bedtime.   Vibegron  (GEMTESA ) 75 MG TABS Take 1 tablet (75 mg total) by mouth daily.   vitamin B-12 (CYANOCOBALAMIN ) 1000 MCG tablet Take 1,000 mcg by mouth daily.   No facility-administered encounter medications on file as of 06/30/2024.    Social History: Social History[1]  Family Medical History: Family History  Problem Relation Age of Onset   Breast cancer Sister 45    Physical Examination: There were no vitals filed for this visit.  General: Patient is well developed, well nourished, calm, collected, and in no apparent distress. Attention to examination is appropriate.  Respiratory: Patient is breathing without any difficulty.   NEUROLOGICAL:     Awake, alert, oriented to person, place, and time.  Speech is clear and fluent. Fund of knowledge is appropriate.   Cranial Nerves: Pupils equal round and reactive to light.  Facial tone is symmetric.    *** ROM of cervical spine *** pain *** posterior cervical tenderness. *** tenderness in bilateral trapezial region.   *** ROM of lumbar spine *** pain *** posterior lumbar tenderness.   No abnormal lesions on exposed skin.   Strength: Side Biceps Triceps Deltoid Interossei Grip Wrist Ext. Wrist Flex.  R 5 5 5 5 5 5 5   L 5 5 5 5 5 5 5    Side Iliopsoas Quads Hamstring PF DF EHL  R 5 5 5 5 5 5   L 5 5 5 5 5 5    Reflexes are ***2+ and symmetric at the biceps, brachioradialis, patella and achilles.   Hoffman's is absent.  Clonus is not present.   Bilateral upper and lower extremity sensation is intact to light touch.     Gait is normal.   ***No difficulty with tandem gait.    Medical Decision Making  Imaging: Lumbar xrays dated 03/31/24:  FINDINGS:  Severe vascular calcification.  No aggressive osseus lesion.  No definite compression fracture, although osteopenia and curvature limit  assessment.   Multilevel severe endplate irregularity, osteophyte formation and disc  height loss throughout the lumbar spine.  There is associated dextrocurvature with apex in the upper lumbar spine.  Moderate lower lumbar facet hypertrophy.  Impression  1.  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 2.  Moderate lower lumbar facet hypertrophy.  BRENDAN CHRISTOPHER CLINE, MD     CT myelogram of lumbar spine dated 01/16/22:  FINDINGS: LUMBAR MYELOGRAM FINDINGS:   Dextroscoliosis. Trace retrolisthesis at L1-L2 and L2-L3. Trace anterolisthesis at L4-L5. No dynamic instability. Small ventral extradural defects from T12-L1 through L5-S1. No high-grade spinal canal stenosis or nerve root effacement.   CT LUMBAR MYELOGRAM FINDINGS:   Segmentation: Standard.   Alignment: Unchanged dextroscoliosis. Unchanged trace stepwise retrolisthesis from T12-L1 through L2-L3. Unchanged trace anterolisthesis at L4-L5.   Vertebrae: No acute fracture or other focal pathologic process.   Conus medullaris and cauda equina: Conus extends to the L1-L2 level. Conus and cauda equina appear normal.   Paraspinal and other soft tissues: Aortoiliac atherosclerotic vascular disease. Bilateral renal simple cysts  again noted. No follow-up imaging is recommended.   Disc levels:   T12-L1: Progressive circumferential disc osteophyte complex with moderate left and mild right facet arthropathy. New mild left-greater-than-right lateral recess stenosis and bilateral neuroforaminal stenosis. No spinal canal stenosis.   L1-L2: Unchanged left eccentric disc osteophyte complex with moderate left and mild right facet arthropathy. Unchanged mild left lateral recess stenosis and mild left neuroforaminal stenosis. No spinal canal or right neuroforaminal stenosis.   L2-L3: Unchanged small circumferential disc osteophyte complex and mild bilateral facet arthropathy.  Unchanged mild left lateral recess stenosis. No spinal canal or neuroforaminal stenosis.   L3-L4: Unchanged small circumferential disc osteophyte complex eccentric to the right. Unchanged moderate to severe bilateral facet arthropathy. Unchanged moderate to severe right and mild left neuroforaminal stenosis. No spinal canal stenosis.   L4-L5: Unchanged mild disc bulging and moderate to severe bilateral facet arthropathy. Unchanged mild-to-moderate bilateral neuroforaminal stenosis. No spinal canal stenosis.   L5-S1: Unchanged small circumferential disc osteophyte complex and moderate bilateral facet arthropathy. Unchanged mild bilateral neuroforaminal stenosis. No spinal canal stenosis.   IMPRESSION: 1. Multilevel lumbar spondylosis as described above, mostly similar to prior MRI. Unchanged moderate to severe right neuroforaminal stenosis at L3-L4. 2. Aortic Atherosclerosis (ICD10-I70.0).     Electronically Signed   By: Elsie ONEIDA Shoulder M.D.   On: 01/16/2022 12:27  I have personally reviewed the images and agree with the above interpretation.  Assessment and Plan: Philip Brady is a pleasant 84 y.o. male has ***  Treatment options discussed with patient and following plan made:   - Order for physical therapy for *** spine ***. Patient to call to schedule appointment. *** - Continue current medications including ***. Reviewed dosing and side effects.  - Prescription for ***. Reviewed dosing and side effects. Take with food.  - Prescription for *** to take prn muscle spasms. Reviewed dosing and side effects. Discussed this can cause drowsiness.  - MRI of *** to further evaluate *** radiculopathy. No improvement time or medications (***).  - Referral to PM[facility] at [facility] to discuss possible *** injections.  - Will schedule phone visit to review MRI results once I get them back.   I spent a total of *** minutes in face-to-face and non-face-to-face activities related to this patient's care  today including review of outside records, review of imaging, review of symptoms, physical exam, discussion of differential diagnosis, discussion of treatment options, and documentation.   Thank you for involving me in the care of this patient.   Philip Boys PA-C Dept. of Neurosurgery      [1]  Social History Tobacco Use   Smoking status: Former   Smokeless tobacco: Never  Vaping Use   Vaping status: Never Used  Substance Use Topics   Alcohol use: Not Currently   Drug use: Never   "

## 2024-06-26 ENCOUNTER — Inpatient Hospital Stay
Admission: RE | Admit: 2024-06-26 | Discharge: 2024-06-26 | Disposition: A | Payer: Self-pay | Source: Ambulatory Visit | Attending: Orthopedic Surgery | Admitting: Orthopedic Surgery

## 2024-06-26 ENCOUNTER — Other Ambulatory Visit: Payer: Self-pay

## 2024-06-26 DIAGNOSIS — Z049 Encounter for examination and observation for unspecified reason: Secondary | ICD-10-CM

## 2024-06-30 ENCOUNTER — Ambulatory Visit: Payer: Self-pay | Admitting: Orthopedic Surgery

## 2024-07-22 ENCOUNTER — Ambulatory Visit: Admitting: Urology
